# Patient Record
Sex: Male | Born: 1935 | Race: White | Hispanic: No | Marital: Married | Smoking: Former smoker
Health system: Southern US, Community
[De-identification: ages and names within clinical notes are randomized; demographics above are authoritative.]

## PROBLEM LIST (undated history)

## (undated) DIAGNOSIS — I2510 Atherosclerotic heart disease of native coronary artery without angina pectoris: Secondary | ICD-10-CM

## (undated) DIAGNOSIS — E7800 Pure hypercholesterolemia, unspecified: Secondary | ICD-10-CM

## (undated) DIAGNOSIS — J189 Pneumonia, unspecified organism: Secondary | ICD-10-CM

## (undated) DIAGNOSIS — E039 Hypothyroidism, unspecified: Secondary | ICD-10-CM

## (undated) DIAGNOSIS — C44300 Unspecified malignant neoplasm of skin of unspecified part of face: Secondary | ICD-10-CM

## (undated) DIAGNOSIS — S0291XA Unspecified fracture of skull, initial encounter for closed fracture: Secondary | ICD-10-CM

## (undated) DIAGNOSIS — I2109 ST elevation (STEMI) myocardial infarction involving other coronary artery of anterior wall: Secondary | ICD-10-CM

## (undated) DIAGNOSIS — I6529 Occlusion and stenosis of unspecified carotid artery: Secondary | ICD-10-CM

## (undated) DIAGNOSIS — I10 Essential (primary) hypertension: Secondary | ICD-10-CM

## (undated) HISTORY — DX: Atherosclerotic heart disease of native coronary artery without angina pectoris: I25.10

## (undated) HISTORY — DX: Occlusion and stenosis of unspecified carotid artery: I65.29

## (undated) HISTORY — PX: CORONARY ANGIOPLASTY: SHX604

## (undated) HISTORY — PX: SKIN CANCER EXCISION: SHX779

## (undated) HISTORY — DX: Pure hypercholesterolemia, unspecified: E78.00

## (undated) HISTORY — DX: ST elevation (STEMI) myocardial infarction involving other coronary artery of anterior wall: I21.09

## (undated) HISTORY — PX: CATARACT EXTRACTION W/ INTRAOCULAR LENS  IMPLANT, BILATERAL: SHX1307

---
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
## 1991-07-20 DIAGNOSIS — I2109 ST elevation (STEMI) myocardial infarction involving other coronary artery of anterior wall: Secondary | ICD-10-CM

## 1991-07-20 HISTORY — DX: ST elevation (STEMI) myocardial infarction involving other coronary artery of anterior wall: I21.09

## 1997-10-24 ENCOUNTER — Other Ambulatory Visit: Admission: RE | Admit: 1997-10-24 | Discharge: 1997-10-24 | Payer: Self-pay | Admitting: Cardiology

## 1997-12-17 ENCOUNTER — Other Ambulatory Visit: Admission: RE | Admit: 1997-12-17 | Discharge: 1997-12-17 | Payer: Self-pay | Admitting: Cardiology

## 2002-09-17 ENCOUNTER — Ambulatory Visit (HOSPITAL_COMMUNITY): Admission: RE | Admit: 2002-09-17 | Discharge: 2002-09-17 | Payer: Self-pay | Admitting: Gastroenterology

## 2002-11-26 ENCOUNTER — Ambulatory Visit (HOSPITAL_COMMUNITY): Admission: RE | Admit: 2002-11-26 | Discharge: 2002-11-27 | Payer: Self-pay | Admitting: Cardiology

## 2004-07-19 HISTORY — PX: CORONARY ANGIOPLASTY WITH STENT PLACEMENT: SHX49

## 2005-01-04 ENCOUNTER — Ambulatory Visit (HOSPITAL_COMMUNITY): Admission: RE | Admit: 2005-01-04 | Discharge: 2005-01-05 | Payer: Self-pay | Admitting: Cardiology

## 2008-01-16 ENCOUNTER — Encounter: Payer: Self-pay | Admitting: Cardiology

## 2008-02-21 ENCOUNTER — Encounter: Payer: Self-pay | Admitting: Cardiology

## 2008-04-09 ENCOUNTER — Encounter: Payer: Self-pay | Admitting: Cardiology

## 2008-07-30 ENCOUNTER — Encounter: Payer: Self-pay | Admitting: Cardiology

## 2009-01-29 ENCOUNTER — Encounter: Payer: Self-pay | Admitting: Cardiology

## 2009-07-31 ENCOUNTER — Encounter: Payer: Self-pay | Admitting: Cardiology

## 2010-02-02 ENCOUNTER — Encounter: Payer: Self-pay | Admitting: Cardiology

## 2010-08-05 ENCOUNTER — Encounter: Payer: Self-pay | Admitting: Cardiology

## 2010-08-07 ENCOUNTER — Ambulatory Visit: Payer: Self-pay | Admitting: Cardiology

## 2010-12-04 NOTE — Op Note (Signed)
NAME:  Ronald English, Ronald English NO.:  [id number]   MEDICAL RECORD NO.:  [id number]                   PATIENT TYPE:  AMB   LOCATION:  ENDO                                 FACILITY:  [facility]   PHYSICIAN:  Petra Kuba, M.D.                 DATE OF BIRTH:  1926-06-09   DATE OF PROCEDURE:  09/17/2002  DATE OF DISCHARGE:                                 OPERATIVE REPORT   PROCEDURE:  Colonoscopy.   INDICATIONS:  A strong family history of colon cancer; way overdue for  colonic screening.  Multiple mild GI complaints.  Consent was signed after  risks benefits, methods and options were thoroughly discussed in the office.   MEDICATIONS USED:  1. Demerol 50 mg .  2. Versed 5 mg.   DESCRIPTION OF PROCEDURE:  Rectal inspection is pertinent for external  hemorrhoids.  Digital examination was negative.  Pediatric video adjustable  colonoscope was inserted, easily advanced around the colon to the cecum.  This did require some abdominal pressure but no position changes.  Based on  the patient's wishes at that juncture, we turned him on his back.  The scope  was inserted a short way into the terminal ileum, which was normal.  Photo  documentation was obtained.  The scope was slowly withdrawn.  The prep was  adequate.   Other than some left-sided occasional diverticula seen on insertion, no  other abnormalities were seen on insertion.  On slow withdrawal through the  colon, again the diverticula were confirmed on the left side (just  occasional).   There were no other lesions, masses or other abnormalities were seen as we  slowly withdrew back  to the rectum.  When we got to the mid-sigmoid, we turned him back onto his  left side for another few minutes.  Once back in the rectum the scope was  retroflexed, pertinent for some internal hemorrhoids.  The scope was then  admitted down a short way upper left side of the colon.  The air was  suctioned, the patient rolled back  onto his back, air was suctioned after we  advanced to level further.  The scope was then removed.  The patient  tolerated the procedure well.  There was no obvious immediate complication.   ENDOSCOPIC DIAGNOSES:  1. Small internal and external hemorrhoids.  2. Digital left-sided diverticula.  3. Otherwise within normal limits to the terminal ileum.    PLAN:  We will repeat guaiacs per primary care.  Will be happy to see him  back p.r.n., particularly if symptoms continue.  Otherwise, consider repeat  colon screening in five years if doing well medically.  Petra Kuba, M.D.    MEM/MEDQ  D:  09/17/2002  T:  09/17/2002  Job:  [id number]   cc:   Colleen Can. Deborah Chalk, M.D.  [address], [address]  [place]  [place] [address]  Fax: [phone]

## 2010-12-04 NOTE — Discharge Summary (Signed)
NAME:  Ronald English, Ronald English NO.:  [id number]   MEDICAL RECORD NO.:  [id number]                   PATIENT TYPE:  OIB   LOCATION:  6525                                 FACILITY:  [facility]   PHYSICIAN:  Colleen Can. Deborah Chalk, M.D.            DATE OF BIRTH:  01/03/1926   DATE OF ADMISSION:  11/26/2002  DATE OF DISCHARGE:  11/27/2002                                 DISCHARGE SUMMARY   DISCHARGE DIAGNOSES:  1. Recurrent episodes of chest pain with subsequent elective cardiac     catheterization, stent deployment x2 to the right coronary artery, stent     deployment x1 to the left anterior descending.   SECONDARY DISCHARGE DIAGNOSES:  1. Ischemic heart disease with previous anteroseptal myocardial infarction     treated with angioplasty in 6/93 with subsequent repeat angioplasty to     the left anterior descending in 12/93.  2. Hyperlipidemia with refusal of statin therapy.  3. Diverticulosis.  4. Hypertension.   HISTORY OF PRESENT ILLNESS:  The patient is a 77 year old male who has known  ischemic heart disease.  He had previous heart attack 11 years ago and had  angioplasty acutely at that time.  He had restenosis and had repeat  angioplasty several months after that to the left anterior descending.  He  has basically done well from a cardiac standpoint since that time.  He does  refuse to take statin therapy but takes a multitude of vitamins.  He  presented to the office on 11/23/02 after an approximate two year absence.  He  had recurrent episodes of chest pain which was somewhat similar to his  previous chest pain syndrome.  He had a multitude of other somatic  complaints as well.  In light of his change in symptomatology, he was  referred for elective cardiac catheterization to sort out his complaint.   Please see dictated History and Physical for further patient presentation  and profile.   LABORATORY DATA:  CBC was normal.  Cholesterol panel showed a  total  cholesterol of 228, triglycerides 154, LDL 141, HDL of 56.  CBC was normal.  PT and PTT were unremarkable.   HOSPITAL COURSE:  The patient was admitted electively in order to undergo  cardiac catheterization.  The procedure was performed by Colleen Can. Deborah Chalk,  M.D. without any known complications.  LV function was normal.  Left main  was normal.  The LAD had a proximal 40% narrowing with a subsequent 80%  narrowing, subsequent 3.0 x 12 mm TAXUS stent was deployed with an overall  satisfactory result obtained.  There were irregularities into the left  circumflex.  The right coronary artery had 95% occlusion proximally as well  as distally.  A 3.0 x 12 mm and subsequent 2.75 x 24 ml stents were placed  to the right coronary with overall satisfactory result obtained as well.  The patient received IV nitroglycerin, heparin,  Integrilin and Plavix.  He  was subsequently transferred to 6500 and today, on 11/27/02, he is doing well  without complaints.   Physical exam is remarkable.  The groin is unremarkable and he is felt to be  a stable candidate for discharge today.   DISCHARGE MEDICATIONS:  1. He will resume his baby aspirin and Lopressor 25 mg a day.  2. Plavix 75 mg a day for six months which he is strongly encouraged to     adhere to.   ACTIVITY:  Activity is to be light over the next 3-5 days.   FOLLOW UP:  Will have him followup in the office in approximately two weeks.  He is asked to call to schedule that appointment.      Juanell Fairly C. Earl Gala, N.P.                 Colleen Can. Deborah Chalk, M.D.    LCO/MEDQ  D:  11/27/2002  T:  11/28/2002  Job:  [id number]

## 2010-12-04 NOTE — Cardiovascular Report (Signed)
NAME:  Ronald English, Ronald English NO.:  [id number]   MEDICAL RECORD NO.:  [id number]          PATIENT TYPE:  OIB   LOCATION:  2872                         FACILITY:  [facility]   PHYSICIAN:  Colleen Can. Deborah Chalk, M.D.DATE OF BIRTH:  09-Jun-1926   DATE OF PROCEDURE:  01/04/2005  DATE OF DISCHARGE:                              CARDIAC CATHETERIZATION   HISTORY:  Ronald English has had previous PCI on multiple occasions.  He had a  previous anterior septal myocardial infarction in 1993.  He has had stents  in the left anterior descending and right coronary artery and had  essentially normal left ventricular function at that time.  Because of  increasing chest pain and abnormal stress Cardiolite study with apical  ischemia, he was referred for catheterization.   PROCEDURE:  Left heart catheterization with selective coronary angiography,  left ventricular angiography.   TYPE AND SITE OF ENTRY:  Percutaneous right femoral artery.   CATHETER:  6 French 4 curved Judkins right and left coronary catheters, 6  French pigtail ventriculogram catheter, 6 [place] JL4 guide, Prowater balloon  as well as an Asahi soft, initially 2 by 9 mm Maverick, subsequently attempt  at 2.5 by 13 mm Cypher unsuccessfully, then 2.5 by 15 mm Quantum Maverick  proximally deployed with 2.5 by 13 mm Cypher stent proximally with a 2.5 by  8 mm Taxus stent unsuccessful in crossing the distal lesion.   CONTRAST MATERIAL:  Omnipaque, 360 mL.   MEDICATIONS GIVEN PRIOR TO THE PROCEDURE:  Valium 10 mg p.o.   MEDICATIONS GIVEN DURING THE PROCEDURE:  Versed 2 mg IV, Integrilin and  heparin, intracoronary nitroglycerin.   COMMENTS:  The patient tolerated the procedure well.   HEMODYNAMIC DATA:  The aortic pressure was 93/41, LV 101/0-7, there was no  aortic gradient noted on pull back.   ANGIOGRAPHIC DATA:  1.  Left main coronary artery is calcified but essentially normal.   1.  Right coronary artery has diffuse  irregularities.  The stent is patent      although there is some haziness at the distal portion of the stent and      it is felt to be patent.  There is a small posterior descending vessel      but no significant continuation branch.  The right coronary artery is      patent.   1.  Left circumflex continues as a trifurcating obtuse marginal branch.  It      has irregularities with 30% proximal narrowing but no significant focal      stenosis.   1.  Left anterior descending  is heavily calcified proximally.  The stent is      present proximally.  Thee is a large first diagonal vessel free of      significant obstructive disease.  There is calcification and tortuosity      in the proximal left anterior descending.  Approximately 3/4 of the way      down the vessel and prior to the apex, there is a severe 90% focal      stenosis.   We then  turned our attention to attempted angioplasty and stent placement in  the distal left anterior descending.  The JL4 6 [place] guide provided  satisfactory back up.  We initially passed a Prowater guide-wire across the  stenosis and were able to pass a 2 by 9 Maverick balloon inflated to 13 and  then 12 atmospheres successfully.  This was a satisfactory predilatation.  We then attempted to place a 2.5 by 13 mm Cypher stent distally.  We were  unable to pass through the area of calcified tortuosity in the proximal left  anterior descending .  We returned with a 2.5 by 15 mm Quantum after raising  a flap in the proximal segment.  The vessel was pre-dilated proximally.  Our  plan at that point in time was to try to stent distally and then return and  stent more proximally.  However, as the 2.5 by 13 mm Cypher stent was passed  into the proximal stenosis, we could no longer pass it distally.  The Cypher  stent was deployed at 20 atmospheres which would over size it relative to  the 2.5 mm diameter.  After the Cypher stent placement, the vessel appeared  to be  satisfactory at that point in time.  We lost our guide-wire position  and were able to recross across the stent.  However, we were unable to place  a 2.5 by 8 mm Taxus stent distally.  We returned and initially dilated again  with a 2 by 9 mm Maverick balloon but then returned with a 2.5 by 15 mm  Quantum balloon.  We had several inflations up to 16 atmospheres.  With  that, it appeared that we had an excellent distal POBA result distally.  The  guide-wires were removed and the final result of the left anterior  descending showed very satisfactory stent placement approximately 2/3 of the  way down the vessel with satisfactory POBA result approximately 3/4 of the  way down the vessel at the initial area of severe stenosis.   The left ventricular angiogram was performed which showed normal left  ventricular function.   IMPRESSION:  1.  Normal left ventricular function.  2.  Severe stenosis in the distal left anterior descending with successful      balloon angioplasty distally with a Cypher stent placement in the more      proximal portion of the vessel.  3.  Diffuse irregularities in the remainder of the coronary anatomy.   DISCUSSION:  It is felt that this should result in significant improvement  in coronary flow for Ronald English.  We will need to be cautious about  possibility of restenosis in the distal left anterior descending.       SNT/MEDQ  D:  01/04/2005  T:  01/04/2005  Job:  [id number]

## 2010-12-04 NOTE — H&P (Signed)
NAME:  CALYX, HAWKER NO.:  [id number]   MEDICAL RECORD NO.:  [id number]          PATIENT TYPE:  OIB   LOCATION:                               FACILITY:  [facility]   PHYSICIAN:  Colleen Can. Deborah Chalk, M.D.DATE OF BIRTH:  Oct 12, 1925   DATE OF ADMISSION:  01/04/2005  DATE OF DISCHARGE:                                HISTORY & PHYSICAL   CHIEF COMPLAINT:  Chest pain.   HISTORY OF PRESENT ILLNESS:  Mr. San is a 77 year old white male who  has a known history of ischemic heart disease that dates back to 40. He  had an anteroseptal myocardial infarction at that time. He has had acute  angioplasty performed to the LAD as well as subsequent stent placement to  the right coronary and left anterior descending in May of 2004. He presents  for a repeat catheterization due to complaints of recurrent chest pain. He  has had a positive Adenosine Cardiolite study which showed an equivocal  apical defect suggestive of ischemia, there was normal ejection fraction of  62% with normal wall motion. In light of his ongoing symptoms he is now  referred for repeat catheterization.   PAST MEDICAL HISTORY:  1.  Atherosclerotic cardiovascular disease. He had a previous anteroseptal      MI in 1993. He had acute angioplasty to the LAD at that time.      Subsequently had re-stenosis and had repeat angioplasty in December of      1993. His last catheterization occurred in 2004 with subsequent stent      placement to the LAD and right coronary artery. At that time he had      normal LV function and scattered irregularities otherwise.  2.  Hyperlipidemia. He refuses to take Statin therapy.  3.  Recent viral illness.  4.  Hypertension.   ALLERGIES:  None.   CURRENT MEDICATIONS:  1.  Aspirin daily.  2.  Lopressor 25 mg daily.  3.  A multitude of vitamins.   FAMILY HISTORY:  Noncontributory.   SOCIAL HISTORY:  He is married. He has no current alcohol or tobacco use.   REVIEW OF  SYSTEMS:  He has had recurrent chest pain over the last 1-2 weeks.  He has been using some nitroglycerin on occasion with questionable relief.  He has been slow to get back into his exercise program due to a recent viral  illness as well as his wife being sick with bronchitis.  He has had vague  left chest pain that has been worse with deep inspiration. 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  He has had really no symptoms at rest.   PHYSICAL EXAMINATION:  GENERAL:  He is a pleasant, elderly male in no acute  distress.  VITAL SIGNS:  Blood pressure 130/60 sitting, 110/70 standing, heart rate 72.  Weight is 121 pounds.  SKIN:  Warm and dry. Color is unremarkable.  LUNGS:  Clear.  HEART:  Regular rhythm.  ABDOMEN:  Soft, positive bowel sounds, nontender.  EXTREMITIES:  Without edema.   PERTINENT LABS:  Pending.   IMPRESSION:  1.  Chest pain.  2.  Abnormal Adenosine Cardiolite.  3.  Known atherosclerotic cardiovascular disease; previous history of      anteroseptal myocardial infarction and subsequent angioplasty acutely in      1993 to the LAD, and most recently with stent placement to the LAD and      right coronary in 2004.  4.  Hyperlipidemia with refusal to take Statin therapy.   PLAN:  Will proceed on with repeat catheterization. The procedure has been  reviewed in full detail with both he and his wife and they are willing to  proceed.       LC/MEDQ  D:  01/01/2005  T:  01/01/2005  Job:  [id number]

## 2010-12-04 NOTE — H&P (Signed)
NAME:  Ronald English, Ronald English NO.:  [id number]   MEDICAL RECORD NO.:  [id number]                   PATIENT TYPE:  OIB   LOCATION:                                       FACILITY:  [facility]   PHYSICIAN:  Colleen Can. Deborah Chalk, M.D.            DATE OF BIRTH:  07/17/1926   DATE OF ADMISSION:  11/26/2002  DATE OF DISCHARGE:                                HISTORY & PHYSICAL   CHIEF COMPLAINT:  New onset of chest pain.   HISTORY OF PRESENT ILLNESS:  The patient is a 77 year old white male who has  known ischemic heart disease.  He suffered a previous anteroseptal MI 11  years ago and had angioplasty acutely to the left anterior descending at  that time.  He did have restenosis and had repeat angioplasty in 12/93.   He presents to the our office on 11/23/02 after an approximate two-year  absence.  He has basically done well up until the past few days.  He notes  that he had an episode where he had pain going up the left side of his neck  as well as in the back of his ear.  This occurred after working out in his  yard.  He subsequently became dizzy and somewhat fuzzy headed.  He  basically lost his energy and felt nauseated but had no frank vomiting.  That following evening, he began having chest tightness, and he has had  recurrent episodes of chest pain which has primary been localized to the  left shoulder.  It is somewhat similar to his previous chest pain syndrome.  He has also noted a sensation of tachycardia with a heart rate of  approximately 120.  He has had recurrent bouts of chest pain that have  occurred with very little activity.  He has clearly had a change in  symptomatology and is now referred for elective cardiac catheterization.   PAST MEDICAL HISTORY:  1. Ischemic heart disease with previous anteroseptal MI treated with     angioplasty in 6/93 with subsequent repeat angioplasty in 12/93.  2. Hyperlipidemia, currently off all therapy.  3.  Diverticulosis.  4. Previous cataract surgery.  5. Previous skin grafting.  6. Remove history of head concussion.  7. Hypertension.   ALLERGIES:  Questionable pain medicine.   CURRENT MEDICINES:  A multitude of vitamins and minerals as well as  Lopressor 25 mg a day and baby aspirin daily.   FAMILY HISTORY:  Father died at 88 due to myocardial infarction.  Mother  died at 65 with a history of stroke.   SOCIAL HISTORY:  He is a remote smoker.  He has no alcohol use.  He does try  to remain active.   REVIEW OF SYSTEMS:  Basically as noted above.  He has also noted some lower  right-sided quadrant discomfort and has had recent colonoscopy with reported  history of diverticulosis.  He has  had no syncope.  He has not used  nitroglycerin for his chest pain.   PHYSICAL EXAMINATION:  GENERAL:  He is an elderly white male in no acute  distress.  VITAL SIGNS:  Blood pressure 140/70 sitting, 120/70 standing, heart rate 72,  weight 135 pounds.  SKIN:  Warm and dry.  Color is unremarkable.  LUNGS:  Clear.  HEART:  Regular rhythm.  ABDOMEN:  Soft, positive bowel sounds, nontender.  EXTREMITIES:  Without edema.  HEENT:  Does show a carotid bruit noted on the right.   LABORATORY DATA:  Pending.   OVERALL IMPRESSION:  1. New onset of chest pain of questionable etiology.  2. Known ischemic heart disease with previous anteroseptal myocardial     infarction 11 years ago treated with angioplasty to the left anterior     descending.  3. Hyperlipidemia, off statin therapy.  4. Hypertension.  5. Reports of tachycardia.  6. Right carotid bruit.   PLAN:  New prescription for nitroglycerin was provided.  Will arrange  cardiac catheterization. Will check carotid Dopplers at that time as well.  He is to call if any problems would arise in the interim or report to the  emergency room.     Juanell Fairly C. Earl Gala, N.P.                 Colleen Can. Deborah Chalk, M.D.    LCO/MEDQ  D:  11/23/2002  T:   11/23/2002  Job:  [id number]

## 2010-12-04 NOTE — Discharge Summary (Signed)
NAME:  Ronald English, Ronald English NO.:  [id number]   MEDICAL RECORD NO.:  [id number]          PATIENT TYPE:  OIB   LOCATION:  6524                         FACILITY:  [facility]   PHYSICIAN:  Colleen Can. Deborah Chalk, M.D.DATE OF BIRTH:  12/17/1925   DATE OF ADMISSION:  01/04/2005  DATE OF DISCHARGE:  01/05/2005                                 DISCHARGE SUMMARY   PRIMARY DISCHARGE DIAGNOSIS:  Chest pain with an abnormal adenosine  Cardiolite and a subsequent elective cardiac catheterization with results as  follows:  Left main coronary artery is calcified but essentially normal.  Right coronary artery has diffuse irregularities; the stent is patent  although there is some haziness of the distal portion of the stent but is  felt to be satisfactory.  There is a small posterior descending vessel with  no significant continuation branch.  The left circumflex continues as a  trifurcating obtuse marginal branch.  It has irregularities with 30%  proximal narrowing but no significant focal stenosis.  The left anterior  descending is heavily calcified proximally.  The stent is present  proximally.  There is a large first diagonal vessel free of significant  obstructive disease.  There is calcification and tortuosity in the proximal  left anterior descending coronary artery.  Approximately three-fourths of  the way down the vessel and prior to the apex, there is severe 90% focal  stenosis.  Subsequently, angioplasty and stent placement was performed to  the distal left anterior descending with some difficulty.  Attempts were  made to place a 2.5 x 13 mm Cypher stent; however, this would not pass and  subsequently it was deployed at the proximal 50% stenosis lesion.  As the  2.5 x 13 mm Cypher stent was passed into the proximal stenosis, it could no  longer be passed distally.  After this stent was placed, the vessel appeared  to be satisfactory.  The guidewire position was lost, yet we were able  to  recross across the stent; however, we were unable to place a 2.5 x 8 mm  Taxus stent distally.  Subsequently we returned and initially dilated again  with a Maverick balloon and subsequently with a Quantum balloon.  There were  several inflations up to 16 atmospheres.  Overall it appeared that an  excellent distal result was obtained postprocedure.  The guidewires were  removed and the final result of the left anterior descending showed a very  satisfactory stent placement approximately two-thirds of the way down the  vessel with satisfactory results three-fourths down the vessel of the  initial area of the severe stenosis.  Overall it is felt that this should  result in significant improvement of the coronary flow for Ronald English.  We  will need to be cautious about the possibility of restenosis in the distal  LAD.   Postprocedure he was transferred to 6500.  Today he is feeling well without  complaints.  His postprocedural labs are satisfactory.  CK-MB is negative.  Troponin is slightly elevated at 0.06.  He is felt to be a stable candidate  for discharge today.  DISCHARGE CONDITION:  Stable.   DISCHARGE MEDICATIONS:  1.  He will resume his aspirin daily.  2.  Lopressor 25 mg daily.  3.  Will add back Plavix 75 mg for the next six months.   We will plan on seeing him back in the office in 10 days.  His activity  should be light in that period.  He is to call if any problems should arise  in the interim.       LC/MEDQ  D:  01/05/2005  T:  01/05/2005  Job:  [id number]

## 2010-12-04 NOTE — Cardiovascular Report (Signed)
NAME:  Ronald English, BATALLA NO.:  [id number]   MEDICAL RECORD NO.:  [id number]                   PATIENT TYPE:  OIB   LOCATION:  6525                                 FACILITY:  [facility]   PHYSICIAN:  Colleen Can. Deborah Chalk, M.D.            DATE OF BIRTH:  01-Mar-1926   DATE OF PROCEDURE:  11/26/2002  DATE OF DISCHARGE:  11/27/2002                              CARDIAC CATHETERIZATION   HISTORY:  The patient presents with progressive angina.  He had previous  remote angioplasty of the left anterior descending.   PROCEDURE:  Left heart catheterization with selective coronary angiography,  left ventricular angiography with stent placement in the right coronary  artery with stent sequentially placed, and stent placement in the mid-left  anterior descending.   TYPE AND SITE OF ENTRY:  Percutaneous, right femoral artery.   CATHETERS:  6 French 4 curved Judkins right and left coronary catheters; 6  French pigtail ventriculographic catheter; JR4 right guide, JL4 left guide,  both 7 [place] guide catheters; high-torque floppy guidewires; and  subsequently the use of a 3.0 x 12 mm stent to the proximal right coronary  artery, 2.75 x 24 mm to the distal right coronary artery, and a 3.0 x 12 mm  stent for the mid-left anterior descending.   CONTRAST MATERIAL:  Omnipaque.   MEDICATIONS GIVEN PRIOR TO THE PROCEDURE:  Valium 10 mg p.o.   MEDICATIONS GIVEN DURING THE PROCEDURE:  Versed, fentanyl, Integrilin, IV  nitroglycerin, heparin, and Plavix.   COMMENTS:  The patient tolerated the procedure well.   HEMODYNAMIC DATA:  1. The aortic pressure was 100/70.  2. LV was 105-12.  3. There was no aortic valve gradient noted on pullback.   ANGIOGRAPHIC DATA:  1. Left main coronary artery had mild narrowing and calcification that was     nonobstructive.  2. Left circumflex:  The left circumflex had 30-40% narrowing proximally.     It had a bifurcating marginal and  posterolateral branch with no     significant obstructive disease.  3. Left anterior descending:  The left anterior descending had diffuse     irregularities as well as calcification throughout.  In the midportion of     the vessel there is 80% somewhat focal narrowing with post-stenotic     dilatation.  There was a distal 50-60% narrowing.  4. Right coronary artery:  The right coronary artery had a proximal 95%     stenosis and then a segmental 90% stenosis before the crux.  There was     diffuse disease in between these two severe stenoses, but it was not     obstructive.   ANGIOPLASTY PROCEDURE:  We initially turned our attention to the right  coronary artery.  A 3.0 x 12 mm Taxus stent was placed primarily across the  proximal lesion.  We then returned with a 2.75 x 24 mm Taxus stent that was  placed across the distal lesion.  The angiographic result was felt to be  quite satisfactory.  Although there was residual narrowed vessel between the  two stents, it was felt to not be flow-limiting.   We then turned our attention to the left anterior descending.  A guidewire  was passed across the stenosis without a great deal of difficulty.  The  third stent was then placed in the mid-left anterior descending across the  80% stenosis at that point in the vessel.  This is a 3.0 x 12 mm Taxus  stent.  The final angiographic result was felt to be excellent.   OVERALL IMPRESSION:  1. Normal left ventricular function.  2. Severe stenosis in a sequential manner in the right coronary artery with     successful stent placement.  3. Severe stenosis in the mid-left anterior descending with successful stent     placement.  4. Somewhat diffuse, scattered irregularities otherwise.   DISCUSSION:  It is felt that the patient tolerated this well and had a  satisfactory dye load and should do well clinically.                                               Colleen Can. Deborah Chalk, M.D.    SNT/MEDQ  D:   11/26/2002  T:  11/28/2002  Job:  [id number]

## 2011-01-15 ENCOUNTER — Encounter: Payer: Self-pay | Admitting: Cardiology

## 2011-01-18 ENCOUNTER — Encounter: Payer: Self-pay | Admitting: Cardiology

## 2011-01-18 ENCOUNTER — Ambulatory Visit (INDEPENDENT_AMBULATORY_CARE_PROVIDER_SITE_OTHER): Payer: Medicare Other | Admitting: Cardiology

## 2011-01-18 DIAGNOSIS — E7800 Pure hypercholesterolemia, unspecified: Secondary | ICD-10-CM

## 2011-01-18 DIAGNOSIS — I2109 ST elevation (STEMI) myocardial infarction involving other coronary artery of anterior wall: Secondary | ICD-10-CM

## 2011-01-18 DIAGNOSIS — I6529 Occlusion and stenosis of unspecified carotid artery: Secondary | ICD-10-CM

## 2011-01-18 NOTE — Assessment & Plan Note (Addendum)
The patient is not having new symptoms. I would like to consider stress testing him as it has been 3 years and he has had multiple stents. However, I'm sure after talking to him that I need to earn his trust him know him better before ordering any tests.  We will try to encourage continued risk reduction.  (Greater than 40 minutes with face to face and review of all hospital and office records.)

## 2011-01-18 NOTE — Assessment & Plan Note (Signed)
His lipids are not at all at target but her refuses any statins.

## 2011-01-18 NOTE — Progress Notes (Signed)
HPI The patient presents as a new patient after Dr. Deborah Chalk retired.  He has a history of coronary disease as described below. His last catheterization was 2006 with a stress test in 2009. Since that time he says he does well. The patient denies any new symptoms such as chest discomfort, neck or arm discomfort. There has been no new shortness of breath, PND or orthopnea. There have been no reported palpitations, presyncope or syncope.  He does yard work.  He does complain of low weights though he eats well.  Allergies  Allergen Reactions  . Codeine     Current Outpatient Prescriptions  Medication Sig Dispense Refill  . metoprolol tartrate (LOPRESSOR) 25 MG tablet Take 25 mg by mouth.        . Thiamine HCl (VITAMIN B-1) 100 MG tablet Take 100 mg by mouth daily.        Marland Kitchen thyroid (ARMOUR) 30 MG tablet Take 30 mg by mouth daily.          Past Medical History  Diagnosis Date  . Anteroseptal myocardial infarction 1993    2006 left main normal, right coronary artery patent stent, circumflex 30% marginal stenosis, LAD distal 90% stenosis. There is a patent proximal stent. He has apparently had Cypher stenting to the distal segment.  . Hypercholesterolemia   . H/O: hypothyroidism   . Carotid stenosis     No past surgical history on file.  ROS:  As stated in the HPI and negative for all other systems.  PHYSICAL EXAM BP 147/68  Pulse 63  Ht 5\' 7"  (1.702 m)  Wt 118 lb (53.524 kg)  BMI 18.48 kg/m2 GENERAL:  Well appearing HEENT:  Pupils equal round and reactive, fundi not visualized, oral mucosa unremarkable NECK:  No jugular venous distention, waveform within normal limits, carotid upstroke brisk and symmetric, no bruits, no thyromegaly LYMPHATICS:  No cervical, inguinal adenopathy LUNGS:  Clear to auscultation bilaterally BACK:  No CVA tenderness CHEST:  Unremarkable HEART:  PMI not displaced or sustained,S1 and S2 within normal limits, no S3, no S4, no clicks, no rubs, no murmurs ABD:   Flat, positive bowel sounds normal in frequency in pitch, no bruits, no rebound, no guarding, no midline pulsatile mass, no hepatomegaly, no splenomegaly EXT:  2 plus pulses upper diminished lower, no edema, no cyanosis no clubbing SKIN:  No rashes no nodules NEURO:  Cranial nerves II through XII grossly intact, motor grossly intact throughout PSYCH:  Cognitively intact, oriented to person place and time   EKG:  Sinus rhythm, rate 63, axis within normal limits, intervals within normal limits, no acute ST-T wave changes.   ASSESSMENT AND PLAN

## 2011-01-18 NOTE — Patient Instructions (Signed)
Follow up in 6 months with Dr Hochrein.  You will receive a letter in the mail 2 months before you are due.  Please call us when you receive this letter to schedule your follow up appointment.  Continue current medications  

## 2011-01-18 NOTE — Assessment & Plan Note (Signed)
I had a long discussion with him.  I found a carotid Doppler from 2004 demonstrating 60-80% left stenosis and 40-60% right stenosis. This was in the hospital and clearly is good study though he says he's never had one of these. I have suggested repeat carotid Doppler and discussed with him the risk of stroke. He will consider this. Again I think a suspicious of these results and just meeting me may not trust my suggestions will again I hope to gain his confidence.

## 2011-01-22 ENCOUNTER — Encounter: Payer: Self-pay | Admitting: Cardiology

## 2012-11-16 ENCOUNTER — Ambulatory Visit (INDEPENDENT_AMBULATORY_CARE_PROVIDER_SITE_OTHER): Payer: Medicare Other | Admitting: Cardiology

## 2012-11-16 ENCOUNTER — Encounter: Payer: Self-pay | Admitting: Cardiology

## 2012-11-16 VITALS — BP 150/64 | HR 75 | Ht 67.0 in | Wt 115.4 lb

## 2012-11-16 DIAGNOSIS — I2510 Atherosclerotic heart disease of native coronary artery without angina pectoris: Secondary | ICD-10-CM | POA: Insufficient documentation

## 2012-11-16 DIAGNOSIS — Z8679 Personal history of other diseases of the circulatory system: Secondary | ICD-10-CM

## 2012-11-16 DIAGNOSIS — Z8639 Personal history of other endocrine, nutritional and metabolic disease: Secondary | ICD-10-CM

## 2012-11-16 DIAGNOSIS — E7800 Pure hypercholesterolemia, unspecified: Secondary | ICD-10-CM

## 2012-11-16 MED ORDER — NITROGLYCERIN 0.4 MG SL SUBL: 0.4000 mg | SUBLINGUAL_TABLET | SUBLINGUAL | Status: DC | PRN

## 2012-11-16 NOTE — Progress Notes (Signed)
Ronald English Date of Birth: Jul 23, 1925 Medical Record #[id number]  History of Present Illness: Ronald English is seen to establish cardiac care. This is my first interaction with Ronald English. He is a former patient of Ronald English. He saw Ronald English in 2012 but wanted to see me today. He has a fairly extensive coronary history. He had a myocardial infarction in 1993 that was treated with direct angioplasty. He reports that he had to go back later to have this opened up again. In 2004 he underwent stenting of the LAD and right coronary. The proximal right coronary was stented with a 3.0 x 12 mm Taxus stent the distal right coronary with a 2.75 x 24 mm Taxus stent in the LAD with a 3.0 x 12 mm Taxus stent. In 2006 he presented again in head stenting of the mid to distal LAD with a Cypher stent. The distal LAD was treated with PCI after being unable to deliver a stent. His last stress test in 2009 was normal.  He presents at this time with symptoms of angina. This has been present for at least 6 months. He initially noted this after cutting  cord wood for his house with a chainsaw. This is described as severe tightness and pain in his left arm and shoulder with some tightness in his chest. It did resolve with rest. He gets similar symptoms with exertion now. 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 He still gets up on his roof to clean out his gutters. 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  Ronald English refuses to take cholesterol-lowering medication. He states that he took something over-the-counter and got his cholesterol down to 165 but he thought this was too low and so he stopped taking it. He states that you can "die from low cholesterol". He also will not take aspirin because he thinks this caused him to have some visual changes in the past. he thinks that higher doses of Lopressor in the past made him  feel too tired. He insists that he doesn't have plaque in his arteries but that Ronald English told him his arteries were simply shrinking.  Current Outpatient Prescriptions on File Prior to Visit  Medication Sig Dispense Refill  . metoprolol tartrate (LOPRESSOR) 25 MG tablet Take 25 mg by mouth.        . Thiamine HCl (VITAMIN B-1) 100 MG tablet Take 100 mg by mouth daily.        Marland Kitchen thyroid (ARMOUR) 30 MG tablet Take 30 mg by mouth daily.         No current facility-administered medications on file prior to visit.    Allergies  Allergen Reactions  . Codeine     Past Medical History  Diagnosis Date  . Anteroseptal myocardial infarction 1993    2006 left main normal, right coronary artery patent stent, circumflex 30% marginal stenosis, LAD distal 90% stenosis. There is a patent proximal stent. He has apparently had Cypher stenting to the distal segment.  . Hypercholesterolemia   . H/O: hypothyroidism   . Carotid stenosis   . CAD (coronary artery disease)     History reviewed. No pertinent past surgical history.  History  Smoking status  . Former Smoker  . Quit date: 07/19/1968  Smokeless tobacco  . Not on file    History  Alcohol Use: Not  on file    Family History  Problem Relation Age of Onset  . Stroke Mother   . Angina Father     Review of Systems: as noted in history of present illness.  All other systems were reviewed and are negative.  Physical Exam: BP 150/64  Pulse 75  Ht 5\' 7"  (1.702 m)  Wt 115 lb 6.4 oz (52.345 kg)  BMI 18.07 kg/m2  SpO2 99%  He is an elderly white male in no acute distress. HEENT: Normocephalic, atraumatic. Pupils equal round and reactive. Sclera clear. Oropharynx is clear. Neck is without JVD, adenopathy, thyromegaly, or bruits. Lungs: Clear Cardiovascular: Regular rate and rhythm. Normal S1 and S2. No gallop, murmur, or click. Abdomen: Soft and nontender. No masses or bruits. Extremities: No cyanosis or edema. Pedal pulses are  good. Skin: Warm and dry. Neuro: Alert and oriented x3. Cranial nerves II through XII are intact. LABORATORY DATA:   Assessment / Plan: 1. Coronary disease with remote anterior myocardial infarction. Multiple stenting procedures in the past. Now with class II angina. Patient is really unwilling to take any additional medical therapy. I recommended that he continue on his current dose of metoprolol and use nitroglycerin sublingual as needed for pain. I stressed the importance of taking aspirin and the benefits of cholesterol-lowering medication. He has no interest in taking this. We discussed possible followup stress testing but I don't know what I would do with the results. He is not a candidate for stenting given his inability to follow through with medical guidelines. He might be a candidate for bypass but he states he would not consider this. If his anginal symptoms progress we could consider additional medication such as a long-acting nitrate or calcium channel blocker but I think in appearance with medical regimen is probably not going to be very good.  2. Hyperlipidemia. Patient refuses to take cholesterol-lowering therapy.  3. Hypothyroidism. Labs are monitored by his primary care.

## 2012-11-16 NOTE — Patient Instructions (Signed)
Continue your current therapy  Use Ntg sublingual as needed for chest/ left arm pain.  I will see you again in 6 months.

## 2013-01-03 ENCOUNTER — Encounter (HOSPITAL_COMMUNITY): Payer: Self-pay | Admitting: Pharmacy Technician

## 2013-01-09 ENCOUNTER — Ambulatory Visit (HOSPITAL_COMMUNITY)
Admission: RE | Admit: 2013-01-09 | Discharge: 2013-01-10 | Disposition: A | Discharge status: Home / Self Care | Payer: Medicare Other | Source: Ambulatory Visit | Attending: Cardiology | Admitting: Cardiology

## 2013-01-09 ENCOUNTER — Encounter (HOSPITAL_COMMUNITY): Payer: Self-pay | Admitting: General Practice

## 2013-01-09 ENCOUNTER — Encounter (HOSPITAL_COMMUNITY)
Admission: RE | Disposition: A | Discharge status: Home / Self Care | Payer: Self-pay | Source: Ambulatory Visit | Attending: Cardiology

## 2013-01-09 DIAGNOSIS — Z9861 Coronary angioplasty status: Secondary | ICD-10-CM | POA: Insufficient documentation

## 2013-01-09 DIAGNOSIS — I10 Essential (primary) hypertension: Secondary | ICD-10-CM | POA: Insufficient documentation

## 2013-01-09 DIAGNOSIS — T82897A Other specified complication of cardiac prosthetic devices, implants and grafts, initial encounter: Secondary | ICD-10-CM | POA: Insufficient documentation

## 2013-01-09 DIAGNOSIS — I2510 Atherosclerotic heart disease of native coronary artery without angina pectoris: Secondary | ICD-10-CM | POA: Insufficient documentation

## 2013-01-09 DIAGNOSIS — Z79899 Other long term (current) drug therapy: Secondary | ICD-10-CM | POA: Insufficient documentation

## 2013-01-09 DIAGNOSIS — Y831 Surgical operation with implant of artificial internal device as the cause of abnormal reaction of the patient, or of later complication, without mention of misadventure at the time of the procedure: Secondary | ICD-10-CM | POA: Insufficient documentation

## 2013-01-09 DIAGNOSIS — I252 Old myocardial infarction: Secondary | ICD-10-CM | POA: Insufficient documentation

## 2013-01-09 DIAGNOSIS — I209 Angina pectoris, unspecified: Secondary | ICD-10-CM | POA: Insufficient documentation

## 2013-01-09 DIAGNOSIS — I2584 Coronary atherosclerosis due to calcified coronary lesion: Secondary | ICD-10-CM | POA: Insufficient documentation

## 2013-01-09 DIAGNOSIS — E785 Hyperlipidemia, unspecified: Secondary | ICD-10-CM | POA: Insufficient documentation

## 2013-01-09 HISTORY — DX: Unspecified fracture of skull, initial encounter for closed fracture: S02.91XA

## 2013-01-09 HISTORY — DX: Unspecified malignant neoplasm of skin of unspecified part of face: C44.300

## 2013-01-09 HISTORY — DX: Essential (primary) hypertension: I10

## 2013-01-09 HISTORY — DX: Hypothyroidism, unspecified: E03.9

## 2013-01-09 HISTORY — PX: ATHERECTOMY: SHX5502

## 2013-01-09 HISTORY — DX: Pneumonia, unspecified organism: J18.9

## 2013-01-09 HISTORY — PX: LEFT HEART CATHETERIZATION WITH CORONARY ANGIOGRAM: SHX5451

## 2013-01-09 LAB — POCT ACTIVATED CLOTTING TIME: Activated Clotting Time: 334 s

## 2013-01-09 SURGERY — LEFT HEART CATHETERIZATION WITH CORONARY ANGIOGRAM
Anesthesia: LOCAL

## 2013-01-09 MED ORDER — CLOPIDOGREL BISULFATE 75 MG PO TABS
75.0000 mg | ORAL_TABLET | Freq: Every day | ORAL | Status: DC
  Administered 2013-01-10: 75 mg via ORAL

## 2013-01-09 MED ORDER — SODIUM CHLORIDE 0.9 % IJ SOLN: 3.0000 mL | Freq: Two times a day (BID) | INTRAMUSCULAR | Status: DC

## 2013-01-09 MED ORDER — METOPROLOL TARTRATE 25 MG PO TABS
25.0000 mg | ORAL_TABLET | Freq: Two times a day (BID) | ORAL | Status: DC
  Administered 2013-01-09 – 2013-01-10 (×2): 25 mg via ORAL
  Filled 2013-01-09 (×3): qty 1

## 2013-01-09 MED ORDER — BIVALIRUDIN 250 MG IV SOLR
INTRAVENOUS | Status: AC
  Filled 2013-01-09: qty 250

## 2013-01-09 MED ORDER — SODIUM CHLORIDE 0.9 % IV SOLN: 1.0000 mL/kg/h | INTRAVENOUS | Status: AC

## 2013-01-09 MED ORDER — FENTANYL CITRATE 0.05 MG/ML IJ SOLN
INTRAMUSCULAR | Status: AC
  Filled 2013-01-09: qty 2

## 2013-01-09 MED ORDER — SODIUM CHLORIDE 0.9 % IV SOLN: 250.0000 mL | INTRAVENOUS | Status: DC | PRN

## 2013-01-09 MED ORDER — ASPIRIN 81 MG PO CHEW
CHEWABLE_TABLET | ORAL | Status: AC
  Filled 2013-01-09: qty 4

## 2013-01-09 MED ORDER — HEPARIN SODIUM (PORCINE) 1000 UNIT/ML IJ SOLN
INTRAMUSCULAR | Status: AC
  Filled 2013-01-09: qty 1

## 2013-01-09 MED ORDER — SODIUM CHLORIDE 0.9 % IJ SOLN: 3.0000 mL | INTRAMUSCULAR | Status: DC | PRN

## 2013-01-09 MED ORDER — MIDAZOLAM HCL 2 MG/2ML IJ SOLN
INTRAMUSCULAR | Status: AC
  Filled 2013-01-09: qty 2

## 2013-01-09 MED ORDER — SODIUM CHLORIDE 0.9 % IV SOLN
INTRAVENOUS | Status: DC
  Administered 2013-01-09: 06:00:00 via INTRAVENOUS

## 2013-01-09 MED ORDER — ASPIRIN 81 MG PO CHEW
324.0000 mg | CHEWABLE_TABLET | ORAL | Status: DC
Start: 2013-01-10 — End: 2013-01-09

## 2013-01-09 MED ORDER — VITAMIN B-1 100 MG PO TABS
100.0000 mg | ORAL_TABLET | Freq: Every day | ORAL | Status: DC
  Administered 2013-01-09 – 2013-01-10 (×2): 100 mg via ORAL
  Filled 2013-01-09 (×2): qty 1

## 2013-01-09 MED ORDER — LIDOCAINE HCL (PF) 1 % IJ SOLN
INTRAMUSCULAR | Status: AC
  Filled 2013-01-09: qty 30

## 2013-01-09 MED ORDER — NITROGLYCERIN 0.2 MG/ML ON CALL CATH LAB
INTRAVENOUS | Status: AC
  Filled 2013-01-09: qty 1

## 2013-01-09 MED ORDER — ACETAMINOPHEN 325 MG PO TABS: 650.0000 mg | ORAL_TABLET | ORAL | Status: DC | PRN

## 2013-01-09 MED ORDER — NITROGLYCERIN 0.4 MG SL SUBL: 0.4000 mg | SUBLINGUAL_TABLET | SUBLINGUAL | Status: DC | PRN

## 2013-01-09 MED ORDER — TICAGRELOR 90 MG PO TABS
ORAL_TABLET | ORAL | Status: AC
  Filled 2013-01-09: qty 2

## 2013-01-09 MED ORDER — ONDANSETRON HCL 4 MG/2ML IJ SOLN: 4.0000 mg | Freq: Four times a day (QID) | INTRAMUSCULAR | Status: DC | PRN

## 2013-01-09 MED ORDER — THYROID 30 MG PO TABS
30.0000 mg | ORAL_TABLET | Freq: Every day | ORAL | Status: DC
  Administered 2013-01-09: 30 mg via ORAL
  Filled 2013-01-09 (×3): qty 1

## 2013-01-09 MED ORDER — ATORVASTATIN CALCIUM 10 MG PO TABS
10.0000 mg | ORAL_TABLET | Freq: Every day | ORAL | Status: DC
  Filled 2013-01-09 (×2): qty 1

## 2013-01-09 MED ORDER — HEPARIN (PORCINE) IN NACL 2-0.9 UNIT/ML-% IJ SOLN
INTRAMUSCULAR | Status: AC
  Filled 2013-01-09: qty 1000

## 2013-01-09 MED ORDER — VERAPAMIL HCL 2.5 MG/ML IV SOLN
INTRAVENOUS | Status: AC
  Filled 2013-01-09: qty 4

## 2013-01-09 NOTE — Progress Notes (Signed)
Baby ASA x 4 given per order; unable to document at this time in [facility]

## 2013-01-09 NOTE — Interval H&P Note (Signed)
History and Physical Interval Note:  01/09/2013 9:00 AM  Ronald English  has presented today for surgery, with the diagnosis of Chest pain  The various methods of treatment have been discussed with the patient and family. After consideration of risks, benefits and other options for treatment, the patient has consented to  Procedure(s): LEFT HEART CATHETERIZATION WITH CORONARY ANGIOGRAM (N/A) and possible PTCA  as a surgical intervention .  The patient's history has been reviewed, patient examined, no change in status, stable for surgery.  I have reviewed the patient's chart and labs.  Questions were answered to the patient's satisfaction.     Cath Lab Visit (complete for each Cath Lab visit)  Clinical Evaluation Leading to the Procedure:   ACS: no  Non-ACS:    Anginal Classification: CCS III  Anti-ischemic medical therapy: Minimal Therapy (1 class of medications)  Non-Invasive Test Results: No non-invasive testing performed  Prior CABG: No previous CABG   [facility]

## 2013-01-09 NOTE — H&P (Signed)
  Please see office visit notes for complete details of HPI.  

## 2013-01-09 NOTE — CV Procedure (Signed)
Procedure performed:  Left heart catheterization including hemodynamic monitoring of the left ventricle, LV gram, selective right and left coronary arteriography. PTCA and of the ostial and proximal LAD with a 1.5 mm atherectomy burr, PTCA and balloon angioplasty of the same region with a 3.0 x 15 mm Trek balloon.  Performed: Femoral arteriogram hemostasis obtained by manual pressure due to calcification of the femoral artery.  Indication patient is a 79 year-old male with history of hypertension (), hyperlipidemia (), history of known coronary artery and history of RCA stenting in 2006 with a 3.0 x 12 mm Taxus stent and a 2.75 x 24 mm Taxus stent in the distal RCA, mid LAD 3.0 x 12 mm Taxus stent. Distal LAD treated with balloon angioplasty. He now presents with class III angina pectoris. Due to his classic presentation, known coronary artery disease, he was brought directly to the cardiac catheterization lab without performing noninvasive testing.   Hemodynamic data:  Left ventricular pressure was 151/5with LVEDP of 19 mm mercury. Aortic pressure was 148/58 with a mean of 94 mm mercury. There was no pressure gradient across the aortic valve.   Left ventricle: Performed in the RAO projection revealed LVEF of 50. Mild global hypokinesis.   Right coronary artery: Is a moderate to large caliber vessel, with diffuse proximal calcification, previously placed stent in the mid RCA is patent with mild instent restenosis, distal RCA stent is widely patent, between the made RCA stent in the distal RCA stent, the crux has a 80-90% mild calcified stenosis. It is dominant.  Left main coronary artery is large and mildly calcified and bifurcates immediately.  Circumflex coronary artery: A large vessel giving origin to a large obtuse marginal 1, OM 1 has a large second branch which is 2.0-2.5 mm and has a ostial 99% stenosis. OM1 itself has mild luminal irregularity the mid circumflex after the origin of OM1 has  a 20-30% stenosis. There is again mild diffuse calcification evident.Marland Kitchen   LAD:  Is a moderate to large caliber vessel. Has severe diffuse calcification. The ostial and proximal LAD has a high-grade 99% stenosis. The LAD gives origin to several small to moderate size diagonals diffuse ostial disease. Mid LAD stent that was previously placed is widely patent with mild neointimal hyperplasia. Distal end of the stent shows mild 30-40% instent restenosis. The balloon angioplasty site in the apical LAD is patent.   Interventional data: Successful PTCA and rotational atherectomy of the proximal and ostial LAD with a 1.5 mm rotational burr followed by balloon angioplasty with a 3.0 x 15 mm trek balloon at peak of 12 atmospheric pressure x2. Stenosis reduced from 99% to less than 10%, with brisk TIMI-3 to TIMI-3 flow maintained at the end of the procedure. Stenting was not performed due to heavy calcification, excellent results with atherectomy.  Technique: Under sterile precautions using a 5 French right femoral arterial access, a 6 French sheath was introduced into the right femoral artery under fluoroscopy guidance. A 5 [place] multipurpose B2 catheter was advanced into the ascending aorta and right coronary artery was cannulated, then left coronary artery was cannulated and angiography was performed in multiple views.  A 5 French pigtail catheter advanced then into the left ventricle. Hemodynamics are analysed. LV angiogram  performed in RAO projection. Catheter pulled into the ascending aorta and  Catheter exchanged out of the body over J-Wire. NO immediate complications noted. Patient tolerated the procedure well.   Technique of intervention: I exchanged the 5 French sheath was 7  French sheath. I then utilized Angiomax for anticoagulation and then advanced a 300 cm x 0.009" rotational floppy wire into the LAD with moderate amount of difficulty. I placed the tip of the wire in the distal LAD. This was followed by  passage of a 1.5 mm bur which was utilized to perform atherectomy at160,000 rpm/sec, intracoronary nitroglycerin administration and angiography performed. Then I utilized the balloon as dictated above and performed two balloon inflations for 60 seconds each followed by intracoronary administration and repeat angiography. Excellent results was evident. The guidewire was withdrawn out of the body. Right femoral arteriogram was performed through the arterial access sheath, due to calcification it was decided against closing this with a closure device.  Patient was transferred to the recovery area in stable condition. Total of 105 cc of contrast was utilized for diagnostic and interventional procedure.   Disposition: Will be discharged hometomorrow with outpatient follow up. Patient has a high-grade stenosis in the mid RCA, will need angioplasty in a staged fashion. The OM1 stenosis is high-grade, however this be left alone due to the complexity of the anatomy. I expect significant improvement in his symptoms of angina pectoris.

## 2013-01-09 NOTE — Interval H&P Note (Signed)
History and Physical Interval Note:  01/09/2013 7:45 AM  Ronald English  has presented today for surgery, with the diagnosis of Chest pain  The various methods of treatment have been discussed with the patient and family. After consideration of risks, benefits and other options for treatment, the patient has consented to  Procedure(s): LEFT HEART CATHETERIZATION WITH CORONARY ANGIOGRAM (N/A) a and possible PTCA s a surgical intervention .  The patient's history has been reviewed, patient examined, no change in status, stable for surgery.  I have reviewed the patient's chart and labs.  Questions were answered to the patient's satisfaction.     Pamella Pert

## 2013-01-10 LAB — CBC
HCT: 37.2 % — ABNORMAL LOW (ref 39.0–52.0)
Hemoglobin: 12.8 g/dL — ABNORMAL LOW (ref 13.0–17.0)
RBC: 4.16 MIL/uL — ABNORMAL LOW (ref 4.22–5.81)
WBC: 9.7 10*3/uL (ref 4.0–10.5)

## 2013-01-10 LAB — BASIC METABOLIC PANEL
BUN: 19 mg/dL (ref 6–23)
Creatinine, Ser: 1.02 mg/dL (ref 0.50–1.35)
GFR calc Af Amer: 76 mL/min — ABNORMAL LOW (ref >90)
GFR calc non Af Amer: 65 mL/min — ABNORMAL LOW (ref >90)

## 2013-01-10 MED ORDER — ASPIRIN 81 MG PO TABS: 81.0000 mg | ORAL_TABLET | Freq: Every day | ORAL | Status: DC

## 2013-01-10 MED FILL — Sodium Chloride IV Soln 0.9%: INTRAVENOUS | Qty: 50 | Status: AC

## 2013-01-10 NOTE — Care Management Note (Signed)
    Page 1 of 1   01/10/2013     11:20:33 AM   CARE MANAGEMENT NOTE 01/10/2013  Patient:  Ronald English, Ronald English   Account Number:  [id number]  Date Initiated:  01/10/2013  Documentation initiated by:  Oletta Cohn  Subjective/Objective Assessment:   79 yo male admitted with chest pain     Action/Plan:   left heart cath/ medication assistance   Anticipated DC Date:  01/10/2013   Anticipated DC Plan:  HOME/SELF CARE         Choice offered to / List presented to:             Status of service:   Medicare Important Message given?   (If response is "NO", the following Medicare IM given date fields will be blank) Date Medicare IM given:   Date Additional Medicare IM given:    Discharge Disposition:    Per UR Regulation:    If discussed at Long Length of Stay Meetings, dates discussed:    Comments:  01/10/13 @ 1030.Marland KitchenMarland KitchenOletta Cohn, RN, BSN, [employer] [phone] Spoke with pt at bedside concerning medication assistance. Pt states he paid $152.99 for Plavix refill prescription at [facility].  NCM called [facility] to compare prices- Pharm Tech states that Plavix generic will be $30/30 day supply. Relayed information to pt.Marland KitchenMarland Kitchenpt will consider changing pharmacies.

## 2013-01-10 NOTE — Progress Notes (Signed)
CARDIAC REHAB PHASE I   PRE:  Rate/Rhythm: 89SR  BP:  Supine:   Sitting: 150/80  Standing:    SaO2:   MODE:  Ambulation: 500 ft   POST:  Rate/Rhythm: 104SR  BP:  Supine:   Sitting: 170/74  Standing:    SaO2:  4098-1191 Pt walked 500 ft on RA with hand held asst with fairly steady gait. Denied CP. To chair after walk. Reviewed NTG use, angina, diet. Did not give ex ed or CRP 2 since pt for staged procedure. Refer to cardiologist for activity.   Luetta Nutting, RN BSN  01/10/2013 8:42 AM

## 2013-01-11 NOTE — Discharge Summary (Signed)
Physician Discharge Summary  Patient ID: Ronald English MRN: [id number] DOB/AGE: [birth year] 79 y.o.  Admit date: 01/09/2013 Discharge date: 01/11/2013  Primary Discharge Diagnosis 1. CAD S/P rotational atherectomy and PTCA of the Ostial LAD. H/O MI in 2006: history of known coronary artery and history of RCA stenting in 2006 with a 3.0 x 12 mm Taxus stent and a 2.75 x 24 mm Taxus stent in the distal RCA, mid LAD 3.0 x 12 mm Taxus stent. Distal LAD treated with balloon angioplasty. 2. CYP2C19: Extensive metabolizer. Rec: Plavix for [place]/PCI  Secondary Discharge Diagnosis Hypertension Hyperlipidemia  Significant Diagnostic Studies: 01/09/13:  Procedure performed:  Left heart catheterization including hemodynamic monitoring of the left ventricle, LV gram, selective right and left coronary arteriography. PTCA and of the ostial and proximal LAD with a 1.5 mm atherectomy burr, PTCA and balloon angioplasty of the same region with a 3.0 x 15 mm Trek balloon.  Performed: Femoral arteriogram hemostasis obtained by manual pressure due to calcification of the femoral artery.  Indication patient is a 79 year-old male with history of hypertension (), hyperlipidemia (), history of known coronary artery and history of RCA stenting in 2006 with a 3.0 x 12 mm Taxus stent and a 2.75 x 24 mm Taxus stent in the distal RCA, mid LAD 3.0 x 12 mm Taxus stent. Distal LAD treated with balloon angioplasty. He now presents with class III angina pectoris. Due to his classic presentation, known coronary artery disease, he was brought directly to the cardiac catheterization lab without performing noninvasive testing.  Hemodynamic data:  Left ventricular pressure was 151/5with LVEDP of 19 mm mercury. Aortic pressure was 148/58 with a mean of 94 mm mercury. There was no pressure gradient across the aortic valve.  Left ventricle: Performed in the RAO projection revealed LVEF of 50. Mild global hypokinesis.  Right coronary artery:  Is a moderate to large caliber vessel, with diffuse proximal calcification, previously placed stent in the mid RCA is patent with mild instent restenosis, distal RCA stent is widely patent, between the made RCA stent in the distal RCA stent, the crux has a 80-90% mild calcified stenosis. It is dominant.  Left main coronary artery is large and mildly calcified and bifurcates immediately.  Circumflex coronary artery: A large vessel giving origin to a large obtuse marginal 1, OM 1 has a large second branch which is 2.0-2.5 mm and has a ostial 99% stenosis. OM1 itself has mild luminal irregularity the mid circumflex after the origin of OM1 has a 20-30% stenosis. There is again mild diffuse calcification evident.Marland Kitchen  LAD: Is a moderate to large caliber vessel. Has severe diffuse calcification. The ostial and proximal LAD has a high-grade 99% stenosis. The LAD gives origin to several small to moderate size diagonals diffuse ostial disease. Mid LAD stent that was previously placed is widely patent with mild neointimal hyperplasia. Distal end of the stent shows mild 30-40% instent restenosis. The balloon angioplasty site in the apical LAD is patent.  Interventional data: Successful PTCA and rotational atherectomy of the proximal and ostial LAD with a 1.5 mm rotational burr followed by balloon angioplasty with a 3.0 x 15 mm trek balloon at peak of 12 atmospheric pressure x2. Stenosis reduced from 99% to less than 10%, with brisk TIMI-3 to TIMI-3 flow maintained at the end of the procedure. Stenting was not performed due to heavy calcification, excellent results with atherectomy.   Hospital Course: patient admitted to the hospital for an elective fashion for outpatient coronary angiography  due to class 3 angina pectoris, patient does not want to be on medications, had multivessel enteroplasty in the past.  With a high suspicion for progression of coronary artery disease patient was brought to the cardiac catheterization  lab.  He was found to have a heavily calcified ostial LAD stenosis for which he underwent atherectomy with excellent results.  Patient tolerated the procedure well and the following morning she ambulated and stated he has already started to feel better and he did not have any angina pectoris or shortness of breath or left arm pain with activity that he used to get urine with minimal activity at home.  His groin site had healed well and he was felt stable for discharge.   Recommendations on discharge:    Discharge Exam: Blood pressure 150/80, pulse 98, temperature 98 F (36.7 C), temperature source Oral, resp. rate 20, height 5\' 7"  (1.702 m), weight 51.1 kg (112 lb 10.5 oz), SpO2 97.00%.  General: Moderately built and petitel body habitus who is in no acute distress. Appears stated age. Alert Ox3.   There is no cyanosis. HEENT: normal limits. PERRLA, No JVD.   CARDIAC EXAM: S1, S2 normal, no gallop present. No murmur.   CHEST EXAM: No tenderness of chest wall. LUNGS: Clear to percuss and auscultate.  ABDOMEN: No hepatosplenomegaly. BS normal in all 4 quadrants. Abdomen is non-tender.   EXTREMITY: Full range of movementes, No edema. MUSCULOSKELETAL EXAM: Intact with full range of motion in all 4 extremities.   NEUROLOGIC EXAM: Grossly intact without any focal deficits. Alert O x 3.   VASCULAR EXAM: No skin breakdown. Carotids normal. Extremities: Femoral pulse normal with soft faint bruit left. Popliteal pulse normal ; Pedal pulse normal DP bilateral. Otherwise No prominent pulse felt in the abdomen. No varicose veins. Right groin without hematoma.   Labs:   Lab Results  Component Value Date   WBC 9.7 01/10/2013   HGB 12.8* 01/10/2013   HCT 37.2* 01/10/2013   MCV 89.4 01/10/2013   PLT 133* 01/10/2013    Recent Labs Lab 01/10/13 0526  NA 139  K 4.5  CL 103  CO2 28  BUN 19  CREATININE 1.02  CALCIUM 8.9  GLUCOSE 98    EKG: normal EKG, normal sinus rhythm,  unchanged from previous tracings.   FOLLOW UP PLANS AND APPOINTMENTS    Medication List    TAKE these medications       aspirin 81 MG tablet  Take 1 tablet (81 mg total) by mouth daily.     clopidogrel 75 MG tablet  Commonly known as:  PLAVIX  Take 75 mg by mouth daily.     CO Q-10 PO  Take 1 tablet by mouth 2 (two) times daily.     KELP PO  Take 1 tablet by mouth 2 (two) times daily.     LIVALO 2 MG Tabs  Generic drug:  Pitavastatin Calcium  Take 2 mg by mouth daily.     metoprolol tartrate 25 MG tablet  Commonly known as:  LOPRESSOR  Take 25 mg by mouth 2 (two) times daily.     nitroGLYCERIN 0.4 MG SL tablet  Commonly known as:  NITROSTAT  Place 1 tablet (0.4 mg total) under the tongue every 5 (five) minutes as needed for chest pain.     RIBOFLAVIN PO  Take 1 tablet by mouth 2 (two) times daily.     thiamine 100 MG tablet  Commonly known as:  VITAMIN B-1  Take 100  mg by mouth daily.     thyroid 30 MG tablet  Commonly known as:  ARMOUR  Take 30 mg by mouth daily.           Follow-up Information   Follow up with Pamella Pert, MD On 01/24/2013. (Appointment at 12 noon. Arrive 15 minutes early)    Contact information:   [address] [place] [address] [phone]        Pamella Pert, MD 01/11/2013, 6:24 AM  Pager: [phone] Office: [phone] If no answer: [phone]

## 2014-02-26 ENCOUNTER — Emergency Department (HOSPITAL_COMMUNITY)
Admission: EM | Admit: 2014-02-26 | Discharge: 2014-02-27 | Disposition: A | Discharge status: Home / Self Care | Payer: Medicare Other | Attending: Emergency Medicine | Admitting: Emergency Medicine

## 2014-02-26 ENCOUNTER — Encounter (HOSPITAL_COMMUNITY): Payer: Self-pay | Admitting: Emergency Medicine

## 2014-02-26 ENCOUNTER — Emergency Department (HOSPITAL_COMMUNITY): Payer: Medicare Other

## 2014-02-26 DIAGNOSIS — E871 Hypo-osmolality and hyponatremia: Secondary | ICD-10-CM

## 2014-02-26 DIAGNOSIS — R51 Headache: Secondary | ICD-10-CM | POA: Diagnosis not present

## 2014-02-26 DIAGNOSIS — R5383 Other fatigue: Secondary | ICD-10-CM | POA: Diagnosis not present

## 2014-02-26 DIAGNOSIS — R5381 Other malaise: Secondary | ICD-10-CM | POA: Diagnosis present

## 2014-02-26 LAB — CBC WITH DIFFERENTIAL/PLATELET
BASOS ABS: 0.1 10*3/uL (ref 0.0–0.1)
BASOS PCT: 1 % (ref 0–1)
EOS ABS: 0 10*3/uL (ref 0.0–0.7)
Eosinophils Relative: 0 % (ref 0–5)
HEMATOCRIT: 40 % (ref 39.0–52.0)
HEMOGLOBIN: 14.2 g/dL (ref 13.0–17.0)
LYMPHS PCT: 22 % (ref 12–46)
Lymphs Abs: 1.5 10*3/uL (ref 0.7–4.0)
MCH: 30.9 pg (ref 26.0–34.0)
MCHC: 35.5 g/dL (ref 30.0–36.0)
MCV: 87 fL (ref 78.0–100.0)
MONOS PCT: 9 % (ref 3–12)
Monocytes Absolute: 0.6 10*3/uL (ref 0.1–1.0)
NEUTROS PCT: 68 % (ref 43–77)
Neutro Abs: 4.4 10*3/uL (ref 1.7–7.7)
Platelets: 45 10*3/uL — ABNORMAL LOW (ref 150–400)
RBC: 4.6 MIL/uL (ref 4.22–5.81)
RDW: 13 % (ref 11.5–15.5)
WBC: 6.6 10*3/uL (ref 4.0–10.5)

## 2014-02-26 LAB — COMPREHENSIVE METABOLIC PANEL
ALBUMIN: 3.3 g/dL — AB (ref 3.5–5.2)
ALK PHOS: 61 U/L (ref 39–117)
ALT: 22 U/L (ref 0–53)
AST: 36 U/L (ref 0–37)
Anion gap: 15 (ref 5–15)
BILIRUBIN TOTAL: 0.9 mg/dL (ref 0.3–1.2)
BUN: 33 mg/dL — ABNORMAL HIGH (ref 6–23)
CHLORIDE: 89 meq/L — AB (ref 96–112)
CO2: 22 mEq/L (ref 19–32)
Calcium: 8.9 mg/dL (ref 8.4–10.5)
Creatinine, Ser: 1.11 mg/dL (ref 0.50–1.35)
GFR calc Af Amer: 68 mL/min — ABNORMAL LOW (ref >90)
GFR calc non Af Amer: 59 mL/min — ABNORMAL LOW (ref >90)
GLUCOSE: 107 mg/dL — AB (ref 70–99)
POTASSIUM: 4 meq/L (ref 3.7–5.3)
SODIUM: 126 meq/L — AB (ref 137–147)
TOTAL PROTEIN: 7.1 g/dL (ref 6.0–8.3)

## 2014-02-26 LAB — CBG MONITORING, ED: GLUCOSE-CAPILLARY: 111 mg/dL — AB (ref 70–99)

## 2014-02-26 MED ORDER — SODIUM CHLORIDE 0.9 % IV BOLUS (SEPSIS)
1000.0000 mL | Freq: Once | INTRAVENOUS | Status: AC
  Administered 2014-02-26: 1000 mL via INTRAVENOUS

## 2014-02-26 NOTE — ED Notes (Signed)
Pt's family reports increase weakness and confusion for the past two weeks. Wife states pt sleeps all day and doesn't eat or drink enough. Pt is alert to person only.

## 2014-02-26 NOTE — ED Notes (Signed)
Pt has no facial droop, grips equal and strong, speech is clear.

## 2014-02-27 ENCOUNTER — Encounter (HOSPITAL_COMMUNITY): Payer: Self-pay | Admitting: Radiology

## 2014-02-27 ENCOUNTER — Emergency Department (HOSPITAL_COMMUNITY): Payer: Medicare Other

## 2014-02-27 LAB — URINALYSIS W MICROSCOPIC (NOT AT ARMC)
BILIRUBIN URINE: NEGATIVE
Glucose, UA: NEGATIVE mg/dL
Ketones, ur: 15 mg/dL — AB
Leukocytes, UA: NEGATIVE
Nitrite: NEGATIVE
PH: 5.5 (ref 5.0–8.0)
Protein, ur: 100 mg/dL — AB
SPECIFIC GRAVITY, URINE: 1.021 (ref 1.005–1.030)
Urobilinogen, UA: 1 mg/dL (ref 0.0–1.0)

## 2014-02-27 NOTE — ED Notes (Signed)
See downtime documentation for events prior to [phone]

## 2014-02-27 NOTE — Discharge Instructions (Signed)
Dear Dr. in the next few days. Return for any new or worse symptoms. Make sure that sure that you are drinking and eating well.

## 2014-02-28 ENCOUNTER — Inpatient Hospital Stay (HOSPITAL_COMMUNITY)
Admission: EM | Admit: 2014-02-28 | Discharge: 2014-03-03 | DRG: 064 | Disposition: A | Discharge status: Home Health | Payer: Medicare Other | Attending: Internal Medicine | Admitting: Internal Medicine

## 2014-02-28 ENCOUNTER — Encounter (HOSPITAL_COMMUNITY): Payer: Self-pay | Admitting: Emergency Medicine

## 2014-02-28 ENCOUNTER — Inpatient Hospital Stay (HOSPITAL_COMMUNITY): Payer: Medicare Other

## 2014-02-28 DIAGNOSIS — G92 Toxic encephalopathy: Secondary | ICD-10-CM | POA: Diagnosis present

## 2014-02-28 DIAGNOSIS — N390 Urinary tract infection, site not specified: Secondary | ICD-10-CM | POA: Diagnosis present

## 2014-02-28 DIAGNOSIS — I10 Essential (primary) hypertension: Secondary | ICD-10-CM | POA: Diagnosis present

## 2014-02-28 DIAGNOSIS — Z8639 Personal history of other endocrine, nutritional and metabolic disease: Secondary | ICD-10-CM

## 2014-02-28 DIAGNOSIS — I2109 ST elevation (STEMI) myocardial infarction involving other coronary artery of anterior wall: Secondary | ICD-10-CM

## 2014-02-28 DIAGNOSIS — E871 Hypo-osmolality and hyponatremia: Secondary | ICD-10-CM | POA: Diagnosis present

## 2014-02-28 DIAGNOSIS — R4182 Altered mental status, unspecified: Secondary | ICD-10-CM

## 2014-02-28 DIAGNOSIS — D696 Thrombocytopenia, unspecified: Secondary | ICD-10-CM | POA: Diagnosis present

## 2014-02-28 DIAGNOSIS — I639 Cerebral infarction, unspecified: Secondary | ICD-10-CM | POA: Diagnosis present

## 2014-02-28 DIAGNOSIS — Z85828 Personal history of other malignant neoplasm of skin: Secondary | ICD-10-CM

## 2014-02-28 DIAGNOSIS — I6529 Occlusion and stenosis of unspecified carotid artery: Secondary | ICD-10-CM | POA: Diagnosis present

## 2014-02-28 DIAGNOSIS — I6350 Cerebral infarction due to unspecified occlusion or stenosis of unspecified cerebral artery: Secondary | ICD-10-CM | POA: Diagnosis present

## 2014-02-28 DIAGNOSIS — Z9861 Coronary angioplasty status: Secondary | ICD-10-CM

## 2014-02-28 DIAGNOSIS — E039 Hypothyroidism, unspecified: Secondary | ICD-10-CM | POA: Diagnosis present

## 2014-02-28 DIAGNOSIS — E7800 Pure hypercholesterolemia, unspecified: Secondary | ICD-10-CM | POA: Diagnosis present

## 2014-02-28 DIAGNOSIS — G9340 Encephalopathy, unspecified: Secondary | ICD-10-CM | POA: Diagnosis present

## 2014-02-28 DIAGNOSIS — I2510 Atherosclerotic heart disease of native coronary artery without angina pectoris: Secondary | ICD-10-CM | POA: Diagnosis present

## 2014-02-28 DIAGNOSIS — I252 Old myocardial infarction: Secondary | ICD-10-CM

## 2014-02-28 DIAGNOSIS — Z823 Family history of stroke: Secondary | ICD-10-CM | POA: Diagnosis not present

## 2014-02-28 DIAGNOSIS — Z8679 Personal history of other diseases of the circulatory system: Secondary | ICD-10-CM

## 2014-02-28 DIAGNOSIS — I6523 Occlusion and stenosis of bilateral carotid arteries: Secondary | ICD-10-CM

## 2014-02-28 DIAGNOSIS — Z87891 Personal history of nicotine dependence: Secondary | ICD-10-CM | POA: Diagnosis not present

## 2014-02-28 DIAGNOSIS — Z885 Allergy status to narcotic agent status: Secondary | ICD-10-CM | POA: Diagnosis not present

## 2014-02-28 DIAGNOSIS — G929 Unspecified toxic encephalopathy: Secondary | ICD-10-CM | POA: Diagnosis present

## 2014-02-28 LAB — COMPREHENSIVE METABOLIC PANEL
ALBUMIN: 3.1 g/dL — AB (ref 3.5–5.2)
ALK PHOS: 52 U/L (ref 39–117)
ALT: 18 U/L (ref 0–53)
AST: 26 U/L (ref 0–37)
Anion gap: 13 (ref 5–15)
BILIRUBIN TOTAL: 0.8 mg/dL (ref 0.3–1.2)
BUN: 27 mg/dL — ABNORMAL HIGH (ref 6–23)
CHLORIDE: 91 meq/L — AB (ref 96–112)
CO2: 24 mEq/L (ref 19–32)
CREATININE: 0.99 mg/dL (ref 0.50–1.35)
Calcium: 8.7 mg/dL (ref 8.4–10.5)
GFR calc Af Amer: 84 mL/min — ABNORMAL LOW (ref >90)
GFR calc non Af Amer: 73 mL/min — ABNORMAL LOW (ref >90)
Glucose, Bld: 104 mg/dL — ABNORMAL HIGH (ref 70–99)
POTASSIUM: 3.9 meq/L (ref 3.7–5.3)
SODIUM: 128 meq/L — AB (ref 137–147)
Total Protein: 6.4 g/dL (ref 6.0–8.3)

## 2014-02-28 LAB — URINALYSIS, ROUTINE W REFLEX MICROSCOPIC
Bilirubin Urine: NEGATIVE
GLUCOSE, UA: NEGATIVE mg/dL
Ketones, ur: 15 mg/dL — AB
Leukocytes, UA: NEGATIVE
NITRITE: NEGATIVE
Protein, ur: NEGATIVE mg/dL
Specific Gravity, Urine: 1.016 (ref 1.005–1.030)
Urobilinogen, UA: 1 mg/dL (ref 0.0–1.0)
pH: 6 (ref 5.0–8.0)

## 2014-02-28 LAB — URINE MICROSCOPIC-ADD ON

## 2014-02-28 LAB — CBC
HCT: 37.8 % — ABNORMAL LOW (ref 39.0–52.0)
HEMATOCRIT: 32.8 % — AB (ref 39.0–52.0)
Hemoglobin: 13.1 g/dL (ref 13.0–17.0)
Hemoglobin: 11.3 g/dL — ABNORMAL LOW (ref 13.0–17.0)
MCH: 30.2 pg (ref 26.0–34.0)
MCH: 29.5 pg (ref 26.0–34.0)
MCHC: 34.7 g/dL (ref 30.0–36.0)
MCHC: 34.5 g/dL (ref 30.0–36.0)
MCV: 87.1 fL (ref 78.0–100.0)
MCV: 85.6 fL (ref 78.0–100.0)
PLATELETS: 66 10*3/uL — AB (ref 150–400)
Platelets: 66 10*3/uL — ABNORMAL LOW (ref 150–400)
RBC: 4.34 MIL/uL (ref 4.22–5.81)
RBC: 3.83 MIL/uL — ABNORMAL LOW (ref 4.22–5.81)
RDW: 13 % (ref 11.5–15.5)
RDW: 13.1 % (ref 11.5–15.5)
WBC: 5.3 10*3/uL (ref 4.0–10.5)
WBC: 4.6 10*3/uL (ref 4.0–10.5)

## 2014-02-28 LAB — TROPONIN I: Troponin I: <0.3 ng/mL (ref <0.30)

## 2014-02-28 LAB — AMMONIA: Ammonia: 14 umol/L (ref 11–60)

## 2014-02-28 LAB — PRO B NATRIURETIC PEPTIDE: PRO B NATRI PEPTIDE: 772.4 pg/mL — AB (ref 0–450)

## 2014-02-28 LAB — CBG MONITORING, ED: GLUCOSE-CAPILLARY: 87 mg/dL (ref 70–99)

## 2014-02-28 LAB — CREATININE, SERUM
Creatinine, Ser: 0.92 mg/dL (ref 0.50–1.35)
GFR calc Af Amer: 87 mL/min — ABNORMAL LOW (ref >90)
GFR, EST NON AFRICAN AMERICAN: 75 mL/min — AB (ref >90)

## 2014-02-28 LAB — TSH: TSH: 6.76 u[IU]/mL — ABNORMAL HIGH (ref 0.350–4.500)

## 2014-02-28 MED ORDER — KELP 0.15 MG PO TABS: 1.0000 | ORAL_TABLET | Freq: Every day | ORAL | Status: DC

## 2014-02-28 MED ORDER — SODIUM CHLORIDE 0.9 % IJ SOLN
3.0000 mL | Freq: Two times a day (BID) | INTRAMUSCULAR | Status: DC
  Administered 2014-02-28 – 2014-03-02 (×4): 3 mL via INTRAVENOUS

## 2014-02-28 MED ORDER — HEPARIN SODIUM (PORCINE) 5000 UNIT/ML IJ SOLN: 5000.0000 [IU] | Freq: Three times a day (TID) | INTRAMUSCULAR | Status: DC

## 2014-02-28 MED ORDER — NITROGLYCERIN 0.4 MG SL SUBL: 0.4000 mg | SUBLINGUAL_TABLET | SUBLINGUAL | Status: DC | PRN

## 2014-02-28 MED ORDER — SODIUM CHLORIDE 0.9 % IV SOLN
INTRAVENOUS | Status: DC
  Administered 2014-02-28 – 2014-03-01 (×2): via INTRAVENOUS

## 2014-02-28 MED ORDER — SODIUM CHLORIDE 0.9 % IV BOLUS (SEPSIS)
500.0000 mL | Freq: Once | INTRAVENOUS | Status: AC
  Administered 2014-02-28: 500 mL via INTRAVENOUS

## 2014-02-28 MED ORDER — THYROID 30 MG PO TABS
30.0000 mg | ORAL_TABLET | Freq: Every day | ORAL | Status: DC
  Administered 2014-02-28 – 2014-03-03 (×4): 30 mg via ORAL
  Filled 2014-02-28 (×4): qty 1

## 2014-02-28 NOTE — Progress Notes (Signed)
PHARMACIST - PHYSICIAN ORDER COMMUNICATION  CONCERNING: P&T Medication Policy on Herbal Medications  DESCRIPTION:  This patient's order for:  kelp  has been noted.  This product(s) is classified as an "herbal" or natural product. Due to a lack of definitive safety studies or FDA approval, nonstandard manufacturing practices, plus the potential risk of unknown drug-drug interactions while on inpatient medications, the Pharmacy and Therapeutics Committee does not permit the use of "herbal" or natural products of this type within [facility].   ACTION TAKEN: The pharmacy department is unable to verify this order at this time and your patient has been informed of this safety policy. Please reevaluate patient's clinical condition at discharge and address if the herbal or natural product(s) should be resumed at that time.  Erin Hearing PharmD., BCPS Clinical Pharmacist Pager [phone] 02/28/2014 11:14 PM
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
## 2014-02-28 NOTE — ED Notes (Signed)
Ordered [facility] for diet order

## 2014-02-28 NOTE — ED Notes (Signed)
Asked patient if he could provide urine sample but he is unable to comply at this time.

## 2014-02-28 NOTE — ED Notes (Signed)
Pt was seen here two nights ago with confusion, decreased appetite, sleepiness.  Dr. Nyoka Cowden called and felt the patient needs further work-up for stroke.  Pt was diagnosed with uti and low sodium at the ER.  Dr. Nyoka Cowden reported left facial weakness, not seen at triage.  Pt knows name and birthday, does not know month or year.  Pt alert and weak.  Pt denies any pain.  Dr. Nyoka Cowden requests a thyroid panel.

## 2014-02-28 NOTE — ED Notes (Signed)
Pts family remains at bedside. Pt placed into gown and on monitor. Pt monitored by 5 lead, blood pressure and pulse ox.
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
## 2014-03-01 DIAGNOSIS — I6789 Other cerebrovascular disease: Secondary | ICD-10-CM

## 2014-03-01 DIAGNOSIS — Z8639 Personal history of other endocrine, nutritional and metabolic disease: Secondary | ICD-10-CM

## 2014-03-01 DIAGNOSIS — G9340 Encephalopathy, unspecified: Secondary | ICD-10-CM

## 2014-03-01 DIAGNOSIS — Z862 Personal history of diseases of the blood and blood-forming organs and certain disorders involving the immune mechanism: Secondary | ICD-10-CM

## 2014-03-01 DIAGNOSIS — I639 Cerebral infarction, unspecified: Secondary | ICD-10-CM | POA: Diagnosis present

## 2014-03-01 DIAGNOSIS — E7800 Pure hypercholesterolemia, unspecified: Secondary | ICD-10-CM

## 2014-03-01 LAB — COMPREHENSIVE METABOLIC PANEL
ALBUMIN: 2.6 g/dL — AB (ref 3.5–5.2)
ALK PHOS: 44 U/L (ref 39–117)
ALT: 15 U/L (ref 0–53)
ANION GAP: 12 (ref 5–15)
AST: 23 U/L (ref 0–37)
BILIRUBIN TOTAL: 0.6 mg/dL (ref 0.3–1.2)
BUN: 24 mg/dL — ABNORMAL HIGH (ref 6–23)
CHLORIDE: 98 meq/L (ref 96–112)
CO2: 24 mEq/L (ref 19–32)
Calcium: 8.2 mg/dL — ABNORMAL LOW (ref 8.4–10.5)
Creatinine, Ser: 0.88 mg/dL (ref 0.50–1.35)
GFR calc Af Amer: 88 mL/min — ABNORMAL LOW (ref >90)
GFR calc non Af Amer: 76 mL/min — ABNORMAL LOW (ref >90)
Glucose, Bld: 93 mg/dL (ref 70–99)
POTASSIUM: 3.7 meq/L (ref 3.7–5.3)
SODIUM: 134 meq/L — AB (ref 137–147)
TOTAL PROTEIN: 5.6 g/dL — AB (ref 6.0–8.3)

## 2014-03-01 LAB — OSMOLALITY, URINE: Osmolality, Ur: 531 mOsm/kg (ref 390–1090)

## 2014-03-01 LAB — CBC WITH DIFFERENTIAL/PLATELET
BASOS ABS: 0.1 10*3/uL (ref 0.0–0.1)
BASOS PCT: 2 % — AB (ref 0–1)
EOS ABS: 0 10*3/uL (ref 0.0–0.7)
Eosinophils Relative: 1 % (ref 0–5)
HEMATOCRIT: 34.1 % — AB (ref 39.0–52.0)
HEMOGLOBIN: 11.8 g/dL — AB (ref 13.0–17.0)
LYMPHS ABS: 1.9 10*3/uL (ref 0.7–4.0)
LYMPHS PCT: 42 % (ref 12–46)
MCH: 29.6 pg (ref 26.0–34.0)
MCHC: 34.6 g/dL (ref 30.0–36.0)
MCV: 85.5 fL (ref 78.0–100.0)
MONO ABS: 0.5 10*3/uL (ref 0.1–1.0)
Monocytes Relative: 10 % (ref 3–12)
Neutro Abs: 2 10*3/uL (ref 1.7–7.7)
Neutrophils Relative %: 45 % (ref 43–77)
Platelets: 73 10*3/uL — ABNORMAL LOW (ref 150–400)
RBC: 3.99 MIL/uL — ABNORMAL LOW (ref 4.22–5.81)
RDW: 13 % (ref 11.5–15.5)
WBC: 4.5 10*3/uL (ref 4.0–10.5)

## 2014-03-01 LAB — TROPONIN I
Troponin I: <0.3 ng/mL (ref <0.30)
Troponin I: <0.3 ng/mL (ref <0.30)

## 2014-03-01 LAB — OSMOLALITY: Osmolality: 270 mOsm/kg — ABNORMAL LOW (ref 275–300)

## 2014-03-01 LAB — T4, FREE: FREE T4: 0.88 ng/dL (ref 0.80–1.80)

## 2014-03-01 LAB — HEMOGLOBIN A1C
HEMOGLOBIN A1C: 6 % — AB (ref <5.7)
Mean Plasma Glucose: 126 mg/dL — ABNORMAL HIGH (ref <117)

## 2014-03-01 LAB — SAVE SMEAR

## 2014-03-01 MED ORDER — ASPIRIN 325 MG PO TABS
325.0000 mg | ORAL_TABLET | Freq: Every day | ORAL | Status: DC
  Filled 2014-03-01 (×2): qty 1

## 2014-03-01 MED ORDER — ASPIRIN EC 81 MG PO TBEC: 81.0000 mg | DELAYED_RELEASE_TABLET | Freq: Every day | ORAL | Status: DC

## 2014-03-01 NOTE — Progress Notes (Signed)
PROGRESS NOTE  Ronald English YWV:[id number] DOB: 1925-12-09 DOA: 02/28/2014 PCP: Criselda Peaches, MD  Assessment/Plan: Encephalopathy  -clinically dry on exam  -hydrate pt  -ammonia level ok -MRI brain- + small lesion- consult neuro, add ASA -panculture  -TSH mildly elevated- on armour thyroid -check free t4  CAD/HTN  -no active CP currently  -cycle CEs  -continue home regimen  -check pro BNP- mildly elevated   Hyponatremia  -likely secondary to dehydration  -improved, d/c IVF  Thrombocytopenia  -Unclear etiology  -improved   Code Status: full Family Communication: no answer when I called wife Disposition Plan:    Consultants:    Procedures:      HPI/Subjective: No new c/o  Not sure where his wife went  Objective: Filed Vitals:   03/01/14 0346  BP: 101/52  Pulse: 80  Temp: 97.6 F (36.4 C)  Resp: 18    Intake/Output Summary (Last 24 hours) at 03/01/14 1150 Last data filed at 03/01/14 0400  Gross per 24 hour  Intake      0 ml  Output    450 ml  Net   -450 ml   Filed Weights   02/28/14 2243 03/01/14 0346  Weight: 48.4 kg (106 lb 11.2 oz) 48.3 kg (106 lb 7.7 oz)    Exam:   General:  A+Ox3, NAD  Cardiovascular: rrr  Respiratory: clear  Abdomen: +BS, soft  Musculoskeletal: moves all 4 ext   Data Reviewed: Basic Metabolic Panel:  Recent Labs Lab 02/26/14 2152 02/28/14 1319 02/28/14 1849 03/01/14 0030  NA 126* 128*  --  134*  K 4.0 3.9  --  3.7  CL 89* 91*  --  98  CO2 22 24  --  24  GLUCOSE 107* 104*  --  93  BUN 33* 27*  --  24*  CREATININE 1.11 0.99 0.92 0.88  CALCIUM 8.9 8.7  --  8.2*   Liver Function Tests:  Recent Labs Lab 02/26/14 2152 02/28/14 1319 03/01/14 0030  AST 36 26 23  ALT 22 18 15   ALKPHOS 61 52 44  BILITOT 0.9 0.8 0.6  PROT 7.1 6.4 5.6*  ALBUMIN 3.3* 3.1* 2.6*   No results found for this basename: LIPASE, AMYLASE,  in the last 168 hours  Recent Labs Lab 02/28/14 1854  AMMONIA 14    CBC:  Recent Labs Lab 02/26/14 2152 02/28/14 1319 02/28/14 1849 03/01/14 0030  WBC 6.6 5.3 4.6 4.5  NEUTROABS 4.4  --   --  2.0  HGB 14.2 13.1 11.3* 11.8*  HCT 40.0 37.8* 32.8* 34.1*  MCV 87.0 87.1 85.6 85.5  PLT 45* 66* 66* 73*   Cardiac Enzymes:  Recent Labs Lab 02/28/14 1822 02/28/14 1853 03/01/14 0030 03/01/14 0644  TROPONINI <0.30 <0.30 <0.30 <0.30   BNP (last 3 results)  Recent Labs  02/28/14 1853  PROBNP 772.4*   CBG:  Recent Labs Lab 02/26/14 2141 02/28/14 1331  GLUCAP 111* 87    No results found for this or any previous visit (from the past 240 hour(s)).   Studies: Dg Orbits  02/28/2014   CLINICAL DATA:  Pre MRI screening.  EXAM: ORBITS - COMPLETE 4+ VIEW  COMPARISON:  None.  FINDINGS: There is no evidence of fracture or other significant bone abnormality. No orbital emphysema or sinus air-fluid levels are seen. No radiopaque foreign body.  IMPRESSION: Negative.   Electronically Signed   By: Dereck Ligas M.D.   On: 02/28/2014 20:37   Dg Chest 2 View  02/28/2014   CLINICAL DATA:  Confusion.  Dizziness.  EXAM: CHEST  2 VIEW  COMPARISON:  02/26/2014.  FINDINGS: Lower lung volumes are present. The cardiopericardial silhouette appears within normal limits. Aortic arch atherosclerosis. Chondrocalcinosis is noted in both humeral heads. There is no airspace disease. Chronic thoracic compression fractures appear unchanged compared to prior. No focal consolidation. No pleural effusion. Cardiac stent noted on the lateral view.  IMPRESSION: No acute cardiopulmonary disease. Lower lung volumes than on prior with underlying emphysema.   Electronically Signed   By: Dereck Ligas M.D.   On: 02/28/2014 20:37   Mr Brain Wo Contrast  02/28/2014   CLINICAL DATA:  Encephalopathy.  EXAM: MRI HEAD WITHOUT CONTRAST  TECHNIQUE: Multiplanar, multiecho pulse sequences of the brain and surrounding structures were obtained without intravenous contrast.  COMPARISON:  Prior CT  from 02/27/2014  FINDINGS: Small 5 mm focus of restricted diffusion seen within the deep white matter of the right centrum semi ovale (series 4, image 24) most consistent with a probable small acute ischemic infarct. No associated hemorrhage or significant mass effect. No other acute intracranial infarct. Normal flow voids seen within the intracranial vasculature. Cavernous segments of the internal carotid arteries are mildly ectatic without aneurysm.  Diffuse prominence of the CSF containing spaces is compatible with generalized cerebral atrophy. Scattered and confluent T2/FLAIR hyperintensity within the periventricular and deep white matter both cerebral hemispheres is most consistent with chronic small vessel ischemic changes.  No mass or midline shift. Ventricular prominence related to global cerebral parenchymal volume loss present. No hydrocephalus. No extra-axial fluid collection.  No acute abnormality seen about either orbit.  Paranasal sinuses and mastoid air cells are clear.  Calvarium demonstrates a normal appearance with normal signal intensity. Scalp soft tissues are unremarkable.  IMPRESSION: 1. 5 mm mildly intense focus of restricted diffusion within the white matter of the right centrum semi ovale, suspicious for a small acute ischemic infarct. No associated hemorrhage or significant mass effect. 2. No other acute intracranial abnormality identified. 3. Advanced cerebral atrophy with chronic small vessel ischemic disease.   Electronically Signed   By: Jeannine Boga M.D.   On: 02/28/2014 23:09    Scheduled Meds: . aspirin  325 mg Oral Daily  . sodium chloride  3 mL Intravenous Q12H  . thyroid  30 mg Oral Daily   Continuous Infusions:   Antibiotics Given (last 72 hours)   None      Active Problems:   Hypercholesterolemia   Carotid stenosis   Encephalopathy   CVA (cerebral infarction)    Time spent: 35 min    VANN, [facility] Pager [phone]. If  7PM-7AM, please contact night-coverage at [email or web address], password [facility] 03/01/2014, 11:50 AM  LOS: 1 day
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
## 2014-03-01 NOTE — Progress Notes (Signed)
*  PRELIMINARY RESULTS* Vascular Ultrasound Carotid Duplex (Doppler) has been completed.  Preliminary findings: Right = 40-59% ICA stenosis, based on velocities. However, systolic velocity doubles from previous segment, suggesting a more significant stenosis. Left = 40-59% ICA stenosis.  Vertebral artery flow is antegrade.      Landry Mellow, RDMS, RVT  03/01/2014, 4:10 PM

## 2014-03-01 NOTE — Progress Notes (Signed)
  Echocardiogram 2D Echocardiogram has been performed.  Ronald English 03/01/2014, 4:42 PM

## 2014-03-01 NOTE — Progress Notes (Signed)
Utilization Review Completed.Ronald English T8/14/2015  
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
## 2014-03-01 NOTE — Progress Notes (Signed)
PT Cancellation Note  Patient Details Name: Ronald English MRN: [id number] DOB: 1926/04/25   Cancelled Treatment:    Reason Eval/Treat Not Completed: Patient at procedure or test/unavailable (On way to vascular lab)   INGOLD,Jacky Dross 03/01/2014, 3:44 PM [facility] [phone] (pager)

## 2014-03-02 ENCOUNTER — Inpatient Hospital Stay (HOSPITAL_COMMUNITY): Payer: Medicare Other

## 2014-03-02 ENCOUNTER — Encounter (HOSPITAL_COMMUNITY): Payer: Self-pay | Admitting: Radiology

## 2014-03-02 DIAGNOSIS — I6350 Cerebral infarction due to unspecified occlusion or stenosis of unspecified cerebral artery: Principal | ICD-10-CM

## 2014-03-02 DIAGNOSIS — I6529 Occlusion and stenosis of unspecified carotid artery: Secondary | ICD-10-CM

## 2014-03-02 DIAGNOSIS — I658 Occlusion and stenosis of other precerebral arteries: Secondary | ICD-10-CM

## 2014-03-02 LAB — COMPREHENSIVE METABOLIC PANEL
ALBUMIN: 2.4 g/dL — AB (ref 3.5–5.2)
ALK PHOS: 42 U/L (ref 39–117)
ALT: 12 U/L (ref 0–53)
AST: 19 U/L (ref 0–37)
Anion gap: 11 (ref 5–15)
BUN: 19 mg/dL (ref 6–23)
CHLORIDE: 103 meq/L (ref 96–112)
CO2: 22 mEq/L (ref 19–32)
Calcium: 8.1 mg/dL — ABNORMAL LOW (ref 8.4–10.5)
Creatinine, Ser: 0.81 mg/dL (ref 0.50–1.35)
GFR calc Af Amer: >90 mL/min (ref >90)
GFR, EST NON AFRICAN AMERICAN: 79 mL/min — AB (ref >90)
Glucose, Bld: 95 mg/dL (ref 70–99)
POTASSIUM: 3.8 meq/L (ref 3.7–5.3)
Sodium: 136 mEq/L — ABNORMAL LOW (ref 137–147)
Total Bilirubin: 0.6 mg/dL (ref 0.3–1.2)
Total Protein: 5.1 g/dL — ABNORMAL LOW (ref 6.0–8.3)

## 2014-03-02 LAB — CBC WITH DIFFERENTIAL/PLATELET
Basophils Absolute: 0.1 10*3/uL (ref 0.0–0.1)
Basophils Relative: 1 % (ref 0–1)
EOS ABS: 0.1 10*3/uL (ref 0.0–0.7)
Eosinophils Relative: 1 % (ref 0–5)
HEMATOCRIT: 34.3 % — AB (ref 39.0–52.0)
Hemoglobin: 11.8 g/dL — ABNORMAL LOW (ref 13.0–17.0)
Lymphocytes Relative: 38 % (ref 12–46)
Lymphs Abs: 2.2 10*3/uL (ref 0.7–4.0)
MCH: 29.9 pg (ref 26.0–34.0)
MCHC: 34.4 g/dL (ref 30.0–36.0)
MCV: 87.1 fL (ref 78.0–100.0)
MONO ABS: 0.6 10*3/uL (ref 0.1–1.0)
Monocytes Relative: 11 % (ref 3–12)
NEUTROS ABS: 2.7 10*3/uL (ref 1.7–7.7)
Neutrophils Relative %: 49 % (ref 43–77)
PLATELETS: 105 10*3/uL — AB (ref 150–400)
RBC: 3.94 MIL/uL — ABNORMAL LOW (ref 4.22–5.81)
RDW: 13.1 % (ref 11.5–15.5)
WBC: 5.7 10*3/uL (ref 4.0–10.5)

## 2014-03-02 LAB — URINE CULTURE
COLONY COUNT: NO GROWTH
Culture: NO GROWTH

## 2014-03-02 LAB — LIPID PANEL
CHOLESTEROL: 137 mg/dL (ref 0–200)
HDL: 18 mg/dL — ABNORMAL LOW (ref >39)
LDL CALC: 90 mg/dL (ref 0–99)
TRIGLYCERIDES: 147 mg/dL (ref <150)
Total CHOL/HDL Ratio: 7.6 RATIO
VLDL: 29 mg/dL (ref 0–40)

## 2014-03-02 MED ORDER — CLOPIDOGREL BISULFATE 75 MG PO TABS
75.0000 mg | ORAL_TABLET | Freq: Every day | ORAL | Status: DC
  Administered 2014-03-02 – 2014-03-03 (×2): 75 mg via ORAL
  Filled 2014-03-02 (×2): qty 1

## 2014-03-02 MED ORDER — IOHEXOL 350 MG/ML SOLN
50.0000 mL | Freq: Once | INTRAVENOUS | Status: AC | PRN
  Administered 2014-03-02: 50 mL via INTRAVENOUS

## 2014-03-02 NOTE — Progress Notes (Signed)
Please contact the patients son - Hever Castilleja at [phone] with the results ALL test results (esp CT scan) that were completed on Saturday 03/02/14 Ronald English 03/02/2014 7:43 PM

## 2014-03-02 NOTE — Evaluation (Signed)
Occupational Therapy Evaluation Patient Details Name: Ronald English MRN: [id number] DOB: 06-23-1926 Today's Date: 03/02/2014    History of Present Illness Pt adm with encephalopathy. MRI - 5 mm mildly intense focus of restricted diffusion within the white matter of the right centrum semi ovale, suspicious for a small acute ischemic infarct. PMH -  CAD, MI   Clinical Impression   This 80 yo male admitted with above presents to acute OT at a S level and has this at home. Acute OT will sign off.   Follow Up Recommendations  No OT follow up    Equipment Recommendations  None recommended by OT       Precautions / Restrictions Precautions Precautions: Fall Restrictions Weight Bearing Restrictions: No      Mobility Bed Mobility               General bed mobility comments: Pt up in recliner upon arrival just finishing up with PT  Transfers Overall transfer level: Needs assistance Equipment used: None Transfers: Sit to/from Stand Sit to Stand: S            Balance Overall balance assessment: Needs assistance Sitting-balance support: No upper extremity supported;Feet supported Sitting balance-Leahy Scale: Good     Standing balance support: No upper extremity supported Standing balance-Leahy Scale: Fair                              ADL                                         General ADL Comments: S for all BADLs               Pertinent Vitals/Pain Pain Assessment: No/denies pain     Hand Dominance Right   Extremity/Trunk Assessment Upper Extremity Assessment Upper Extremity Assessment: Overall WFL for tasks assessed         Communication Communication Communication: HOH   Cognition Arousal/Alertness: Awake/alert Behavior During Therapy: WFL for tasks assessed/performed                                  Home Living Family/patient expects to be discharged to:: Private residence Living Arrangements:  Spouse/significant other Available Help at Discharge: Family;Available 24 hours/day Type of Home: House Home Access: Stairs to enter [employer] of Steps: 1   Home Layout: One level     Bathroom Shower/Tub: Walk-in shower;Door Shower/tub characteristics: Door [employer]: Standard     Home Equipment: Shower seat (if needed)   Additional Comments: May have access to a walker.      Prior Functioning/Environment Level of Independence: Independent        Comments: Does alot of gardening             OT Goals(Current goals can be found in the care plan section) Acute Rehab OT Goals Patient Stated Goal: Return home  OT Frequency:                End of Session Equipment Utilized During Treatment:  (None)  Activity Tolerance: Patient tolerated treatment well Patient left: in chair;with call bell/phone within reach;with chair alarm set;with family/visitor present   Time: 3614-4315 OT Time Calculation (min): 22 min Charges:  OT General Charges $OT Visit: 1 Procedure OT Evaluation $  Initial OT Evaluation Tier I: 1 Procedure OT Treatments $Self Care/Home Management : 8-22 mins  Ronald English [phone] 03/02/2014, 11:56 AM

## 2014-03-02 NOTE — Progress Notes (Signed)
Stroke Team Progress Note  HISTORY Ronald English is an 80 y.o. male with significant past medical history of CAD s/p MI, HTN, hypothyroidism, admitted to [facility] due to altered mental status/confusion and concerns for stroke.  At this time he complains of generalized weakness but is obviously confused and thus all information is obtained from the [facility].  He initially presented to the ED 2 0r 3 days before this admission with symptoms of generalized weakness, decreased appetite and fatigue over past 2-3 weeks, and workup was essentially negative apart from ? UTI and hyponatremia/dehydration.Had follow up with PCP about sxs earlier today. PCP was concerned about possible stroke and was directed back to ER for further evaluation.  MRI brain revealed a 5 mm mildly intense focus of restricted diffusion within the white matter of the right centrum semi ovale, suspicious for a small acute ischemic infarct. Otherwise, serologies, UA are unrevealing.  Denies HA, vertigo, double vision, difficulty swallowing, focal weakness or numbness, slurred speech, language or vision impairment.   Date last known well: unable to determine  Time last known well: unable to determine  tPA Given: no, late presentation    SUBJECTIVE Multiple family members present. The patient is much improved. The family feels that he is essentially back to baseline. The patient requests that aspirin be changed to Plavix. He feels that aspirin causes him to have visual problems.  OBJECTIVE Most recent Vital Signs: Filed Vitals:   03/01/14 1514 03/01/14 2015 03/02/14 0537 03/02/14 0542  BP: 145/69 132/58 132/55   Pulse: 60 61 79   Temp: 97.9 F (36.6 C)  99 F (37.2 C)   TempSrc: Oral Oral Oral   Resp: 16 18 18    Height:      Weight:    109 lb 1.6 oz (49.487 kg)  SpO2: 99% 99% 96%    CBG (last 3)   Recent Labs  02/28/14 1331  GLUCAP 87    IV Fluid Intake:     MEDICATIONS  . aspirin  325 mg Oral Daily  . sodium chloride   3 mL Intravenous Q12H  . thyroid  30 mg Oral Daily   PRN:  nitroGLYCERIN  Diet:  Cardiac thin liquids Activity:   DVT Prophylaxis:  SCDs  CLINICALLY SIGNIFICANT STUDIES Basic Metabolic Panel:  Recent Labs Lab 03/01/14 0030 03/02/14 0054  NA 134* 136*  K 3.7 3.8  CL 98 103  CO2 24 22  GLUCOSE 93 95  BUN 24* 19  CREATININE 0.88 0.81  CALCIUM 8.2* 8.1*   Liver Function Tests:  Recent Labs Lab 03/01/14 0030 03/02/14 0054  AST 23 19  ALT 15 12  ALKPHOS 44 42  BILITOT 0.6 0.6  PROT 5.6* 5.1*  ALBUMIN 2.6* 2.4*   CBC:  Recent Labs Lab 03/01/14 0030 03/02/14 0054  WBC 4.5 5.7  NEUTROABS 2.0 2.7  HGB 11.8* 11.8*  HCT 34.1* 34.3*  MCV 85.5 87.1  PLT 73* 105*   Coagulation: No results found for this basename: LABPROT, INR,  in the last 168 hours Cardiac Enzymes:  Recent Labs Lab 02/28/14 1853 03/01/14 0030 03/01/14 0644  TROPONINI <0.30 <0.30 <0.30   Urinalysis:  Recent Labs Lab 02/27/14 0050 02/28/14 2243  COLORURINE AMBER* YELLOW  LABSPEC 1.021 1.016  PHURINE 5.5 6.0  GLUCOSEU NEGATIVE NEGATIVE  HGBUR SMALL* SMALL*  BILIRUBINUR NEGATIVE NEGATIVE  KETONESUR 15* 15*  PROTEINUR 100* NEGATIVE  UROBILINOGEN 1.0 1.0  NITRITE NEGATIVE NEGATIVE  LEUKOCYTESUR NEGATIVE NEGATIVE   Lipid Panel  Component Value Date/Time   CHOL 137 03/02/2014 0054   TRIG 147 03/02/2014 0054   HDL 18* 03/02/2014 0054   CHOLHDL 7.6 03/02/2014 0054   VLDL 29 03/02/2014 0054   LDLCALC 90 03/02/2014 0054   HgbA1C  Lab Results  Component Value Date   HGBA1C 6.0* 03/01/2014    Urine Drug Screen:   No results found for this basename: labopia, cocainscrnur, labbenz, amphetmu, thcu, labbarb    Alcohol Level: No results found for this basename: ETH,  in the last 168 hours  Dg Orbits 02/28/2014    Negative.     Dg Chest 2 View 02/28/2014    No acute cardiopulmonary disease. Lower lung volumes than on prior with underlying emphysema.      Mr Brain Wo Contrast 02/28/2014     1. 5 mm mildly intense focus of restricted diffusion within the white matter of the right centrum semi ovale, suspicious for a small acute ischemic infarct. No associated hemorrhage or significant mass effect.  2. No other acute intracranial abnormality identified.  3. Advanced cerebral atrophy with chronic small vessel ischemic disease.       CT of the brain   02/27/2014 1. No acute intracranial abnormalities.  2. Mild cerebral atrophy with extensive chronic microvascular ischemic changes throughout the deep and periventricular white  matter of the cerebral hemispheres bilaterally.  MRA of the brain    Carotid Doppler - Preliminary findings: Right = 40-59% ICA stenosis, based on velocities. However, systolic velocity doubles from previous segment, suggesting a more significant stenosis. Left = 40-59% ICA stenosis. Vertebral artery flow is antegrade.    2D Echocardiogram - ejection fraction 55-60%. No cardiac source of emboli identified.     EKG - NSR rate 75 BPM. - For complete results please see formal report.   Therapy Recommendations - home health physical therapy recommended   Physical Exam    Mental Status:  Alert, oriented, knows he is in the hospital. Speech fluent without evidence of aphasia. Able to follow 3 step commands without difficulty.  Cranial Nerves:  II: Discs flat bilaterally; Visual fields grossly normal, pupils equal, round, reactive to light and accommodation  III,IV, VI: ptosis not present, extra-ocular motions intact bilaterally  V,VII: smile symmetric, facial light touch sensation normal bilaterally  VIII: hearing normal bilaterally  IX,X: gag reflex present  XI: bilateral shoulder shrug  XII: midline tongue extension without atrophy or fasciculations  Motor:  Right : Upper extremity 5/5 Left: Upper extremity 4+/5 Lower extremity 5/5 Lower extremity 5/5  Tone and bulk:normal tone throughout; no atrophy noted  Sensory: Pinprick and light touch  intact throughout, bilaterally  Deep Tendon Reflexes:  Right: Upper Extremity Left: Upper extremity  biceps (C-5 to C-6) 2/4 biceps (C-5 to C-6) 2/4  tricep (C7) 2/4 triceps (C7) 2/4  Brachioradialis (C6) 2/4 Brachioradialis (C6) 2/4  Lower Extremity Lower Extremity  quadriceps (L-2 to L-4) 2/4 quadriceps (L-2 to L-4) 2/4  Achilles (S1) 2/4 Achilles (S1) 2/4  Plantars:  Right: downgoing Left: downgoing  Cerebellar:  normal finger-to-nose, normal heel-to-shin test  Gait:  Not tested   ASSESSMENT Mr. Ronald English is a 80 y.o. male presenting with generalized weakness and altered mental status. T-PA therapy was not initiated as time of onset was unknown. MRI -5 mm mildly intense focus of restricted diffusion within the white matter of the right centrum semi ovale, suspicious for a small acute ischemic infarct.  Infarct felt secondary to small vessel disease. On no antithrombotics prior  to admission. Now on Plavix 75 mg daily.  for secondary stroke prevention. Patient with resultant resolution of deficits except for very mild left upper extremity weakness.. Stroke work up underway.   History of previous stroke  Cholesterol 137; LDL 90 (no statin prior to admission)  Hemoglobin A1c - 6.0  Right internal carotid artery stenosis  Coronary artery disease with previous MI  Hypertension  Hypothyroidism  Hyponatremia - Na 136  Aspirin intolerance ( visual problems ) changed to Plavix per patient's request.   Hospital day # 2  TREATMENT/PLAN  Continue aspirin 325 mg orally every day for secondary stroke prevention.  Home health physical therapy recommended  CTA of the head and neck to evaluate for atherosclerosis.  Possible discharge later today.   Followup with Dr.Xu in 4 weeks.  Mikey Bussing PA-C [facility] Pager [phone] 03/02/2014, 11:53 AM   SIGNED  CTA done, showing no hemodynamic stenosis.  Will sign off/ Can be d/ crom Neuro stand  point. Leotis Pain This patient requires constant monitoring of vital signs, hemodynamics,respiratory and cardiac monitoring,review of multiple databases, neurological assessment, discussion with family, other specialists and medical decision making of high complexity.I have made any additions or clarifications directly to the above note.      To contact Stroke Continuity provider, please refer to [email or web address] After hours, contact General Neurology

## 2014-03-02 NOTE — Evaluation (Signed)
Physical Therapy Evaluation Patient Details Name: Ronald English MRN: [id number] DOB: 01-03-1926 Today's Date: 03/02/2014   History of Present Illness  Pt adm with encephalopathy. MRI - 5 mm mildly intense focus of restricted diffusion within the white matter of the right centrum semi ovale, suspicious for a small acute ischemic infarct. PMH -  CAD, MI  Clinical Impression  Pt admitted with above. Pt currently with functional limitations due to the deficits listed below (see PT Problem List).  Pt will benefit from skilled PT to increase their independence and safety with mobility to allow discharge to the venue listed below. Feel pt will be able to return home with wife and appropriate equipment and HHPT.     Follow Up Recommendations Home health PT;Supervision/Assistance - 24 hour    Equipment Recommendations  Other (comment) (To be determined)    Recommendations for Other Services       Precautions / Restrictions Precautions Precautions: Fall Restrictions Weight Bearing Restrictions: No      Mobility  Bed Mobility               General bed mobility comments: Pt sitting EOB on arrival.  Transfers Overall transfer level: Needs assistance Equipment used: None Transfers: Sit to/from Stand Sit to Stand: Min guard         General transfer comment: No physical assist needed. Min guard for safety.  Ambulation/Gait Ambulation/Gait assistance: Min assist;Min guard Ambulation Distance (Feet): 250 Feet Assistive device: 1 person hand held assist;None;Rolling walker (2 wheeled) Gait Pattern/deviations: Step-through pattern;Decreased step length - right;Decreased step length - left;Trunk flexed;Narrow base of support Gait velocity: decr Gait velocity interpretation: Below normal speed for age/gender General Gait Details: Pt with slight incr rt lateral lean with gait initially requiring min A. Pt with incr steadiness using walker. With incr distance stability improved and  able to amb in room without assistive device and min guard assist.  Stairs            Wheelchair Mobility    Modified Rankin (Stroke Patients Only) Modified Rankin (Stroke Patients Only) Pre-Morbid Rankin Score: No symptoms Modified Rankin: Moderately severe disability     Balance Overall balance assessment: Needs assistance Sitting-balance support: No upper extremity supported;Feet supported Sitting balance-Leahy Scale: Good     Standing balance support: No upper extremity supported Standing balance-Leahy Scale: Fair                               Pertinent Vitals/Pain Pain Assessment: No/denies pain    Home Living Family/patient expects to be discharged to:: Private residence Living Arrangements: Spouse/significant other Available Help at Discharge: Family;Available 24 hours/day Type of Home: House Home Access: Stairs to enter   Entrance Stairs-Number of Steps: 1 Home Layout: One level   Additional Comments: May have access to a walker.    Prior Function Level of Independence: Independent               Hand Dominance        Extremity/Trunk Assessment   Upper Extremity Assessment: Defer to OT evaluation           Lower Extremity Assessment: Generalized weakness         Communication   Communication: HOH  Cognition Arousal/Alertness: Awake/alert Behavior During Therapy: WFL for tasks assessed/performed                        General Comments  Exercises        Assessment/Plan    PT Assessment Patient needs continued PT services  PT Diagnosis Difficulty walking;Generalized weakness   PT Problem List Decreased strength;Decreased balance;Decreased mobility;Decreased knowledge of use of DME  PT Treatment Interventions DME instruction;Gait training;Functional mobility training;Therapeutic activities;Therapeutic exercise;Balance training;Patient/family education   PT Goals (Current goals can be found in the  Care Plan section) Acute Rehab PT Goals Patient Stated Goal: Return home PT Goal Formulation: With patient Time For Goal Achievement: 03/09/14 Potential to Achieve Goals: Good    Frequency Min 4X/week   Barriers to discharge Decreased caregiver support Pt's wife frail and unable to provide physical assistance.    Co-evaluation               End of Session Equipment Utilized During Treatment: Gait belt Activity Tolerance: Patient tolerated treatment well Patient left: in chair;with call bell/phone within reach;with chair alarm set;with family/visitor present Nurse Communication: Mobility status         Time: 1216-2446 PT Time Calculation (min): 25 min   Charges:   PT Evaluation $Initial PT Evaluation Tier I: 1 Procedure PT Treatments $Gait Training: 8-22 mins   PT G Codes:          Fredrico Beedle 16-Mar-2014, 11:35 AM  [facility] PT [phone]

## 2014-03-02 NOTE — Progress Notes (Signed)
PROGRESS NOTE  Ronald English YTK:[id number] DOB: [birth year] DOA: 02/28/2014 PCP: Criselda Peaches, MD  Assessment/Plan: Encephalopathy prob due to CVA -clinically dry on exam  -hydrate pt  -ammonia level ok -MRI brain- + small lesion- consult neuro -panculture  -TSH mildly elevated- on armour thyroid - free t4 ok- follow as outpatient  CVA -patient does not want an ASA as he says it makes his vision worse -echo:  - Left ventricle: The cavity size was normal. Systolic function was normal. The estimated ejection fraction was in the range of 55% to 60%. Wall motion was normal; there were no regional wall motion abnormalities. Left ventricular diastolic function parameters were normal. - Aortic valve: Valve area (VTI): 1.27 cm^2. Valve area (Vmax): 1.35 cm^2. - Mitral valve: Moderately calcified annulus. Carotid: Right = 40-59% ICA stenosis, based on velocities. However, systolic velocity doubles from previous segment, suggesting a more significant stenosis. Left = 40-59% ICA stenosis   CAD/HTN  -no active CP currently  -CEs negative -continue home regimen  - pro BNP- mildly elevated   Hyponatremia  -likely secondary to dehydration  -improved, d/c IVF  Thrombocytopenia  -Unclear etiology  -improved   Code Status: full Family Communication: wife/daughter at bedside Disposition Plan:    Consultants:    Procedures:      HPI/Subjective: Doing better today per wife Patient answering questions correctly  Objective: Filed Vitals:   03/02/14 0537  BP: 132/55  Pulse: 79  Temp: 99 F (37.2 C)  Resp: 18    Intake/Output Summary (Last 24 hours) at 03/02/14 0830 Last data filed at 03/02/14 0500  Gross per 24 hour  Intake    120 ml  Output    700 ml  Net   -580 ml   Filed Weights   02/28/14 2243 03/01/14 0346 03/02/14 0542  Weight: 48.4 kg (106 lb 11.2 oz) 48.3 kg (106 lb 7.7 oz) 49.487 kg (109 lb 1.6 oz)    Exam:   General:  A+Ox3,  NAD  Cardiovascular: rrr  Respiratory: clear  Abdomen: +BS, soft  Musculoskeletal: moves all 4 ext   Data Reviewed: Basic Metabolic Panel:  Recent Labs Lab 02/26/14 2152 02/28/14 1319 02/28/14 1849 03/01/14 0030 03/02/14 0054  NA 126* 128*  --  134* 136*  K 4.0 3.9  --  3.7 3.8  CL 89* 91*  --  98 103  CO2 22 24  --  24 22  GLUCOSE 107* 104*  --  93 95  BUN 33* 27*  --  24* 19  CREATININE 1.11 0.99 0.92 0.88 0.81  CALCIUM 8.9 8.7  --  8.2* 8.1*   Liver Function Tests:  Recent Labs Lab 02/26/14 2152 02/28/14 1319 03/01/14 0030 03/02/14 0054  AST 36 26 23 19   ALT 22 18 15 12   ALKPHOS 61 52 44 42  BILITOT 0.9 0.8 0.6 0.6  PROT 7.1 6.4 5.6* 5.1*  ALBUMIN 3.3* 3.1* 2.6* 2.4*   No results found for this basename: LIPASE, AMYLASE,  in the last 168 hours  Recent Labs Lab 02/28/14 1854  AMMONIA 14   CBC:  Recent Labs Lab 02/26/14 2152 02/28/14 1319 02/28/14 1849 03/01/14 0030 03/02/14 0054  WBC 6.6 5.3 4.6 4.5 5.7  NEUTROABS 4.4  --   --  2.0 2.7  HGB 14.2 13.1 11.3* 11.8* 11.8*  HCT 40.0 37.8* 32.8* 34.1* 34.3*  MCV 87.0 87.1 85.6 85.5 87.1  PLT 45* 66* 66* 73* 105*   Cardiac Enzymes:  Recent Labs Lab 02/28/14  1822 02/28/14 1853 03/01/14 0030 03/01/14 0644  TROPONINI <0.30 <0.30 <0.30 <0.30   BNP (last 3 results)  Recent Labs  02/28/14 1853  PROBNP 772.4*   CBG:  Recent Labs Lab 02/26/14 2141 02/28/14 1331  GLUCAP 111* 87    Recent Results (from the past 240 hour(s))  URINE CULTURE     Status: None   Collection Time    02/28/14 10:43 PM      Result Value Ref Range Status   Specimen Description URINE, RANDOM   Final   Special Requests NONE   Final   Culture  Setup Time     Final   Value: 03/01/2014 00:02     Performed at [place]     Final   Value: NO GROWTH     Performed at [facility]   Culture     Final   Value: NO GROWTH     Performed at [facility]   Report Status  03/02/2014 FINAL   Final     Studies: Dg Orbits  02/28/2014   CLINICAL DATA:  Pre MRI screening.  EXAM: ORBITS - COMPLETE 4+ VIEW  COMPARISON:  None.  FINDINGS: There is no evidence of fracture or other significant bone abnormality. No orbital emphysema or sinus air-fluid levels are seen. No radiopaque foreign body.  IMPRESSION: Negative.   Electronically Signed   By: Dereck Ligas M.D.   On: 02/28/2014 20:37   Dg Chest 2 View  02/28/2014   CLINICAL DATA:  Confusion.  Dizziness.  EXAM: CHEST  2 VIEW  COMPARISON:  02/26/2014.  FINDINGS: Lower lung volumes are present. The cardiopericardial silhouette appears within normal limits. Aortic arch atherosclerosis. Chondrocalcinosis is noted in both humeral heads. There is no airspace disease. Chronic thoracic compression fractures appear unchanged compared to prior. No focal consolidation. No pleural effusion. Cardiac stent noted on the lateral view.  IMPRESSION: No acute cardiopulmonary disease. Lower lung volumes than on prior with underlying emphysema.   Electronically Signed   By: Dereck Ligas M.D.   On: 02/28/2014 20:37   Mr Brain Wo Contrast  02/28/2014   CLINICAL DATA:  Encephalopathy.  EXAM: MRI HEAD WITHOUT CONTRAST  TECHNIQUE: Multiplanar, multiecho pulse sequences of the brain and surrounding structures were obtained without intravenous contrast.  COMPARISON:  Prior CT from 02/27/2014  FINDINGS: Small 5 mm focus of restricted diffusion seen within the deep white matter of the right centrum semi ovale (series 4, image 24) most consistent with a probable small acute ischemic infarct. No associated hemorrhage or significant mass effect. No other acute intracranial infarct. Normal flow voids seen within the intracranial vasculature. Cavernous segments of the internal carotid arteries are mildly ectatic without aneurysm.  Diffuse prominence of the CSF containing spaces is compatible with generalized cerebral atrophy. Scattered and confluent T2/FLAIR  hyperintensity within the periventricular and deep white matter both cerebral hemispheres is most consistent with chronic small vessel ischemic changes.  No mass or midline shift. Ventricular prominence related to global cerebral parenchymal volume loss present. No hydrocephalus. No extra-axial fluid collection.  No acute abnormality seen about either orbit.  Paranasal sinuses and mastoid air cells are clear.  Calvarium demonstrates a normal appearance with normal signal intensity. Scalp soft tissues are unremarkable.  IMPRESSION: 1. 5 mm mildly intense focus of restricted diffusion within the white matter of the right centrum semi ovale, suspicious for a small acute ischemic infarct. No associated hemorrhage or significant mass effect. 2. No  other acute intracranial abnormality identified. 3. Advanced cerebral atrophy with chronic small vessel ischemic disease.   Electronically Signed   By: Jeannine Boga M.D.   On: 02/28/2014 23:09    Scheduled Meds: . aspirin  325 mg Oral Daily  . sodium chloride  3 mL Intravenous Q12H  . thyroid  30 mg Oral Daily   Continuous Infusions:   Antibiotics Given (last 72 hours)   None      Principal Problem:   CVA (cerebral infarction) Active Problems:   Hypercholesterolemia   Carotid stenosis   Encephalopathy    Time spent: 35 min    Ronald English  [facility] Pager [phone]. If 7PM-7AM, please contact night-coverage at [email or web address], password [facility] 03/02/2014, 8:30 AM  LOS: 2 days

## 2014-03-03 LAB — CBC WITH DIFFERENTIAL/PLATELET
Basophils Absolute: 0.1 10*3/uL (ref 0.0–0.1)
Basophils Relative: 1 % (ref 0–1)
EOS PCT: 1 % (ref 0–5)
Eosinophils Absolute: 0.1 10*3/uL (ref 0.0–0.7)
HCT: 33.2 % — ABNORMAL LOW (ref 39.0–52.0)
Hemoglobin: 11.5 g/dL — ABNORMAL LOW (ref 13.0–17.0)
LYMPHS PCT: 47 % — AB (ref 12–46)
Lymphs Abs: 3.1 10*3/uL (ref 0.7–4.0)
MCH: 30.3 pg (ref 26.0–34.0)
MCHC: 34.6 g/dL (ref 30.0–36.0)
MCV: 87.4 fL (ref 78.0–100.0)
MONOS PCT: 9 % (ref 3–12)
Monocytes Absolute: 0.6 10*3/uL (ref 0.1–1.0)
NEUTROS ABS: 2.8 10*3/uL (ref 1.7–7.7)
Neutrophils Relative %: 42 % — ABNORMAL LOW (ref 43–77)
Platelets: 127 10*3/uL — ABNORMAL LOW (ref 150–400)
RBC: 3.8 MIL/uL — ABNORMAL LOW (ref 4.22–5.81)
RDW: 13.2 % (ref 11.5–15.5)
WBC: 6.7 10*3/uL (ref 4.0–10.5)

## 2014-03-03 LAB — COMPREHENSIVE METABOLIC PANEL
ALT: 12 U/L (ref 0–53)
ANION GAP: 9 (ref 5–15)
AST: 21 U/L (ref 0–37)
Albumin: 2.4 g/dL — ABNORMAL LOW (ref 3.5–5.2)
Alkaline Phosphatase: 43 U/L (ref 39–117)
BUN: 15 mg/dL (ref 6–23)
CO2: 25 mEq/L (ref 19–32)
Calcium: 8.7 mg/dL (ref 8.4–10.5)
Chloride: 103 mEq/L (ref 96–112)
Creatinine, Ser: 0.85 mg/dL (ref 0.50–1.35)
GFR calc Af Amer: 90 mL/min — ABNORMAL LOW (ref >90)
GFR calc non Af Amer: 77 mL/min — ABNORMAL LOW (ref >90)
Glucose, Bld: 99 mg/dL (ref 70–99)
POTASSIUM: 3.9 meq/L (ref 3.7–5.3)
Sodium: 137 mEq/L (ref 137–147)
TOTAL PROTEIN: 5.3 g/dL — AB (ref 6.0–8.3)
Total Bilirubin: 0.5 mg/dL (ref 0.3–1.2)

## 2014-03-03 MED ORDER — ATORVASTATIN CALCIUM 10 MG PO TABS
10.0000 mg | ORAL_TABLET | Freq: Every day | ORAL | Status: DC
  Filled 2014-03-03: qty 1

## 2014-03-03 MED ORDER — ATORVASTATIN CALCIUM 10 MG PO TABS: 10.0000 mg | ORAL_TABLET | Freq: Every day | ORAL | Status: DC

## 2014-03-03 MED ORDER — CLOPIDOGREL BISULFATE 75 MG PO TABS: 75.0000 mg | ORAL_TABLET | Freq: Every day | ORAL | Status: DC

## 2014-03-03 NOTE — Progress Notes (Signed)
Pt discharged per MD order and protocol. Discharge instructions reviewed with patient and all questions answered. Pt given all prescriptions and aware of follow up appointments.  

## 2014-03-03 NOTE — Discharge Summary (Signed)
Physician Discharge Summary  Ronald English HQI:[id number] DOB: 31-Oct-1925 DOA: 02/28/2014  PCP: Criselda Peaches, MD  Admit date: 02/28/2014 Discharge date: 03/03/2014  Time spent: 35 minutes  Recommendations for Outpatient Follow-up:  1. Home health PT 2. 24 hour supervision- spoke with son 3. Monitor TSH as outpatient  Discharge Diagnoses:  Principal Problem:   CVA (cerebral infarction) Active Problems:   Hypercholesterolemia   Carotid stenosis   Encephalopathy   Discharge Condition: improved  Diet recommendation: cardiac  Filed Weights   03/01/14 0346 03/02/14 0542 03/03/14 0500  Weight: 48.3 kg (106 lb 7.7 oz) 49.487 kg (109 lb 1.6 oz) 49.2 kg (108 lb 7.5 oz)    History of present illness:  This is a 80 y.o. year old male with significant past medical history of CAD s/p MI, HTN, hypothyroidism presenting with encephalopathy. Per the family, pt has had decreased appetite and fatigue over past 2-3 weeks. Presented to ER 2-3 days ago 2/ sxs (during epic downtime). Workup was essentially negative apart from ? UTI and hyponatremia/dehydration. Unclear if pt was rxd medication for this. Wife states he was given IVF. Had follow up with PCP about sxs earlier today. PCP was concerned about possible stroke and was directed back to ER for further evaluation.  On presentation, hemodynamically stable. Afebrile. BP 120s-130s/60s. Satting 99% on RA. WBC 6.6, Hgb 14.2, Plt 45, Na 126, Cr 1.11, BUN 33. 8/11, 8/12 Head CT and CXR with no acute findings.    Hospital Course:  CVA: MRI -5 mm mildly intense focus of restricted diffusion within the white matter of the right centrum semi ovale, suspicious for a small acute ischemic infarct. Infarct felt secondary to small vessel disease. On no antithrombotics prior to admission. Now on Plavix 75 mg daily. for secondary stroke prevention. Patient with resultant resolution of deficits except for very mild left upper extremity weakness LDL 90 but HDL  only 18- start statin Patient refused ASA- start plavix  AMS -resolved Work up showed CVA  Hyponatremia  -likely secondary to dehydration  -improved, d/c IVF   Thrombocytopenia  -Unclear etiology  -improved  Procedures:  Echo  Carotid  CTA  Consultations:  neurology  Discharge Exam: Filed Vitals:   03/03/14 0504  BP: 124/64  Pulse: 71  Temp: 98 F (36.7 C)  Resp: 18    General: A+Ox3, NAD Cardiovascular: rrr Respiratory: clear  Discharge Instructions You were cared for by a hospitalist during your hospital stay. If you have any questions about your discharge medications or the care you received while you were in the hospital after you are discharged, you can call the unit and asked to speak with the hospitalist on call if the hospitalist that took care of you is not available. Once you are discharged, your primary care physician will handle any further medical issues. Please note that NO REFILLS for any discharge medications will be authorized once you are discharged, as it is imperative that you return to your primary care physician (or establish a relationship with a primary care physician if you do not have one) for your aftercare needs so that they can reassess your need for medications and monitor your lab values.      Discharge Instructions   Diet - low sodium heart healthy    Complete by:  As directed      Discharge instructions    Complete by:  As directed   Home health PT 24 hour supervision No driving     Increase activity  slowly    Complete by:  As directed             Medication List    STOP taking these medications       RIBOFLAVIN PO      TAKE these medications       atorvastatin 10 MG tablet  Commonly known as:  LIPITOR  Take 1 tablet (10 mg total) by mouth daily at 6 PM.     B-12 PO  Take 1 tablet by mouth daily.     clopidogrel 75 MG tablet  Commonly known as:  PLAVIX  Take 1 tablet (75 mg total) by mouth daily.     CO  Q-10 PO  Take 1 tablet by mouth daily.     KELP PO  Take 1 tablet by mouth daily.     nitroGLYCERIN 0.4 MG SL tablet  Commonly known as:  NITROSTAT  Place 1 tablet (0.4 mg total) under the tongue every 5 (five) minutes as needed for chest pain.     thiamine 100 MG tablet  Commonly known as:  VITAMIN B-1  Take 100 mg by mouth daily.     thyroid 30 MG tablet  Commonly known as:  ARMOUR  Take 30 mg by mouth daily.       Allergies  Allergen Reactions  . Codeine Itching   Follow-up Information   Follow up with Xu,Jindong, MD. Schedule an appointment as soon as possible for a visit in 1 month.   Specialty:  Neurology   Contact information:   [address] [place] [address] [phone]        The results of significant diagnostics from this hospitalization (including imaging, microbiology, ancillary and laboratory) are listed below for reference.    Significant Diagnostic Studies: Ct Angio Head W/cm &/or Wo Cm  03/02/2014   CLINICAL DATA:  Altered mental status. Possible small, acute infarct in the right centrum semiovale on MRI.  EXAM: CT ANGIOGRAPHY HEAD AND NECK  TECHNIQUE: Multidetector CT imaging of the head and neck was performed using the standard protocol during bolus administration of intravenous contrast. Multiplanar CT image reconstructions and MIPs were obtained to evaluate the vascular anatomy. Carotid stenosis measurements (when applicable) are obtained utilizing NASCET criteria, using the distal internal carotid diameter as the denominator.  CONTRAST:  62mL OMNIPAQUE IOHEXOL 350 MG/ML SOLN  COMPARISON:  Brain MRI 02/28/2014.  Head CT 02/27/2014.  FINDINGS: CTA HEAD FINDINGS  Noncontrast head CT is without evidence of acute large territory cortical infarct, mass, midline shift, intracranial hemorrhage, or extra-axial fluid collection. Moderate cerebral atrophy is unchanged. Periventricular white matter hypodensities are unchanged and nonspecific but  compatible with moderate chronic small vessel ischemic disease. Punctate, curvilinear focus of enhancement in the right basal ganglia is likely vascular. Prior bilateral cataract extraction is noted. Mastoid air cells and paranasal sinuses are clear.  Intracranial vertebral arteries are patent to the basilar. PICA origins are patent. SCA origins are patent. Basilar artery is patent without stenosis. PCAs are unremarkable. Posterior communicating arteries are not clearly identified. Internal carotid arteries are patent from skullbase to carotid termini. There is moderate, diffuse carotid siphon atherosclerotic calcification bilaterally without evidence of flow limiting stenosis. There is mild supraclinoid ICA narrowing bilaterally. ACAs and MCAs are unremarkable. No intracranial aneurysm is identified.  Review of the MIP images confirms the above findings.  CTA NECK FINDINGS  Three vessel aortic arch with moderate to extensive aortic arch atherosclerotic calcification. Calcified and noncalcified plaque in the  brachiocephalic and bilateral proximal subclavian arteries resulting in less than 50% stenosis. Right common carotid artery is patent without stenosis. Moderate, predominantly calcified plaque in the proximal right internal carotid artery extends for approximately the first 3 cm of the ICA and results in up to 55% stenosis. Eccentric calcification in the proximal to mid left common carotid artery does not result in stenosis. Mild to moderate calcified plaque in the proximal left ICA results in approximately 45% stenosis. Mild luminal irregularity of the distal cervical ICAs is due to predominantly noncalcified plaque, with a small focus of likely plaque ulceration along the distal left ICA posterior margin. Vertebral arteries are patent with the left being dominant. Prominent calcification is noted adjacent to both vertebral artery origins, with likely mild narrowing of the right vertebral artery at its origin.   Centrilobular emphysema is noted as well as biapical scarring. Multiple small mediastinal lymph nodes are partially visualized. Extensive, multilevel cervical facet arthrosis is present. Advanced multilevel cervical disc degeneration is also seen.  Review of the MIP images confirms the above findings.  IMPRESSION: 1. No major intracranial arterial occlusion. 2. Moderate carotid siphon atherosclerosis. No evidence of high-grade proximal intracranial stenosis. 3. 55% stenosis of the proximal right ICA and 45% stenosis of the proximal left ICA.   Electronically Signed   By: Logan Bores   On: 03/02/2014 16:57   Dg Orbits  02/28/2014   CLINICAL DATA:  Pre MRI screening.  EXAM: ORBITS - COMPLETE 4+ VIEW  COMPARISON:  None.  FINDINGS: There is no evidence of fracture or other significant bone abnormality. No orbital emphysema or sinus air-fluid levels are seen. No radiopaque foreign body.  IMPRESSION: Negative.   Electronically Signed   By: Dereck Ligas M.D.   On: 02/28/2014 20:37   Dg Chest 2 View  02/28/2014   CLINICAL DATA:  Confusion.  Dizziness.  EXAM: CHEST  2 VIEW  COMPARISON:  02/26/2014.  FINDINGS: Lower lung volumes are present. The cardiopericardial silhouette appears within normal limits. Aortic arch atherosclerosis. Chondrocalcinosis is noted in both humeral heads. There is no airspace disease. Chronic thoracic compression fractures appear unchanged compared to prior. No focal consolidation. No pleural effusion. Cardiac stent noted on the lateral view.  IMPRESSION: No acute cardiopulmonary disease. Lower lung volumes than on prior with underlying emphysema.   Electronically Signed   By: Dereck Ligas M.D.   On: 02/28/2014 20:37   Dg Chest 2 View  02/26/2014   CLINICAL DATA:  Cough, congestion and shortness of breath.  EXAM: CHEST  2 VIEW  COMPARISON:  None.  FINDINGS: The lungs are hyperexpanded, with flattening of the hemidiaphragms, compatible with COPD. A 1.0 cm calcified granuloma is noted  at the left midlung zone. Minimal bilateral scarring is noted. The costophrenic angles are incompletely imaged on this study, on the frontal view. There is no evidence of pleural effusion or pneumothorax.  The heart is normal in size; the mediastinal contour is within normal limits. No acute osseous abnormalities are seen. There is mild chronic loss of height at the lower thoracic and upper lumbar spine.  IMPRESSION: Findings of COPD.  No acute cardiopulmonary process seen.   Electronically Signed   By: Garald Balding M.D.   On: 02/26/2014 23:10   Ct Head Wo Contrast  02/27/2014   CLINICAL DATA:  Fatigue.  Dehydration.  EXAM: CT HEAD WITHOUT CONTRAST  TECHNIQUE: Contiguous axial images were obtained from the base of the skull through the vertex without intravenous contrast.  COMPARISON:  No priors.  FINDINGS: Mild cerebral atrophy. Extensive patchy and confluent areas of low attenuation throughout the deep and periventricular white matter of the cerebral hemispheres bilaterally, compatible with chronic microvascular ischemic disease. No definite acute intracranial abnormality. Specifically, no definite signs of acute/subacute cerebral ischemia, no evidence of acute intracranial hemorrhage, no mass, mass effect, hydrocephalus or abnormal intra or extra-axial fluid collections. Visualized paranasal sinuses and mastoids are well pneumatized. No acute displaced skull fractures are noted.  IMPRESSION: 1. No acute intracranial abnormalities. 2. Mild cerebral atrophy with extensive chronic microvascular ischemic changes throughout the deep and periventricular white matter of the cerebral hemispheres bilaterally.   Electronically Signed   By: Vinnie Langton M.D.   On: 02/27/2014 06:29   Ct Angio Neck W/cm &/or Wo/cm  03/02/2014   CLINICAL DATA:  Altered mental status. Possible small, acute infarct in the right centrum semiovale on MRI.  EXAM: CT ANGIOGRAPHY HEAD AND NECK  TECHNIQUE: Multidetector CT imaging of the  head and neck was performed using the standard protocol during bolus administration of intravenous contrast. Multiplanar CT image reconstructions and MIPs were obtained to evaluate the vascular anatomy. Carotid stenosis measurements (when applicable) are obtained utilizing NASCET criteria, using the distal internal carotid diameter as the denominator.  CONTRAST:  7mL OMNIPAQUE IOHEXOL 350 MG/ML SOLN  COMPARISON:  Brain MRI 02/28/2014.  Head CT 02/27/2014.  FINDINGS: CTA HEAD FINDINGS  Noncontrast head CT is without evidence of acute large territory cortical infarct, mass, midline shift, intracranial hemorrhage, or extra-axial fluid collection. Moderate cerebral atrophy is unchanged. Periventricular white matter hypodensities are unchanged and nonspecific but compatible with moderate chronic small vessel ischemic disease. Punctate, curvilinear focus of enhancement in the right basal ganglia is likely vascular. Prior bilateral cataract extraction is noted. Mastoid air cells and paranasal sinuses are clear.  Intracranial vertebral arteries are patent to the basilar. PICA origins are patent. SCA origins are patent. Basilar artery is patent without stenosis. PCAs are unremarkable. Posterior communicating arteries are not clearly identified. Internal carotid arteries are patent from skullbase to carotid termini. There is moderate, diffuse carotid siphon atherosclerotic calcification bilaterally without evidence of flow limiting stenosis. There is mild supraclinoid ICA narrowing bilaterally. ACAs and MCAs are unremarkable. No intracranial aneurysm is identified.  Review of the MIP images confirms the above findings.  CTA NECK FINDINGS  Three vessel aortic arch with moderate to extensive aortic arch atherosclerotic calcification. Calcified and noncalcified plaque in the brachiocephalic and bilateral proximal subclavian arteries resulting in less than 50% stenosis. Right common carotid artery is patent without stenosis.  Moderate, predominantly calcified plaque in the proximal right internal carotid artery extends for approximately the first 3 cm of the ICA and results in up to 55% stenosis. Eccentric calcification in the proximal to mid left common carotid artery does not result in stenosis. Mild to moderate calcified plaque in the proximal left ICA results in approximately 45% stenosis. Mild luminal irregularity of the distal cervical ICAs is due to predominantly noncalcified plaque, with a small focus of likely plaque ulceration along the distal left ICA posterior margin. Vertebral arteries are patent with the left being dominant. Prominent calcification is noted adjacent to both vertebral artery origins, with likely mild narrowing of the right vertebral artery at its origin.  Centrilobular emphysema is noted as well as biapical scarring. Multiple small mediastinal lymph nodes are partially visualized. Extensive, multilevel cervical facet arthrosis is present. Advanced multilevel cervical disc degeneration is also seen.  Review of the MIP images confirms the above  findings.  IMPRESSION: 1. No major intracranial arterial occlusion. 2. Moderate carotid siphon atherosclerosis. No evidence of high-grade proximal intracranial stenosis. 3. 55% stenosis of the proximal right ICA and 45% stenosis of the proximal left ICA.   Electronically Signed   By: Logan Bores   On: 03/02/2014 16:57   Mr Brain Wo Contrast  02/28/2014   CLINICAL DATA:  Encephalopathy.  EXAM: MRI HEAD WITHOUT CONTRAST  TECHNIQUE: Multiplanar, multiecho pulse sequences of the brain and surrounding structures were obtained without intravenous contrast.  COMPARISON:  Prior CT from 02/27/2014  FINDINGS: Small 5 mm focus of restricted diffusion seen within the deep white matter of the right centrum semi ovale (series 4, image 24) most consistent with a probable small acute ischemic infarct. No associated hemorrhage or significant mass effect. No other acute intracranial  infarct. Normal flow voids seen within the intracranial vasculature. Cavernous segments of the internal carotid arteries are mildly ectatic without aneurysm.  Diffuse prominence of the CSF containing spaces is compatible with generalized cerebral atrophy. Scattered and confluent T2/FLAIR hyperintensity within the periventricular and deep white matter both cerebral hemispheres is most consistent with chronic small vessel ischemic changes.  No mass or midline shift. Ventricular prominence related to global cerebral parenchymal volume loss present. No hydrocephalus. No extra-axial fluid collection.  No acute abnormality seen about either orbit.  Paranasal sinuses and mastoid air cells are clear.  Calvarium demonstrates a normal appearance with normal signal intensity. Scalp soft tissues are unremarkable.  IMPRESSION: 1. 5 mm mildly intense focus of restricted diffusion within the white matter of the right centrum semi ovale, suspicious for a small acute ischemic infarct. No associated hemorrhage or significant mass effect. 2. No other acute intracranial abnormality identified. 3. Advanced cerebral atrophy with chronic small vessel ischemic disease.   Electronically Signed   By: Jeannine Boga M.D.   On: 02/28/2014 23:09    Microbiology: Recent Results (from the past 240 hour(s))  CULTURE, BLOOD (ROUTINE X 2)     Status: None   Collection Time    02/28/14  7:10 PM      Result Value Ref Range Status   Specimen Description BLOOD ARM RIGHT   Final   Special Requests BOTTLES DRAWN AEROBIC ONLY 5CC   Final   Culture  Setup Time     Final   Value: 03/01/2014 01:19     Performed at [facility]   Culture     Final   Value:        BLOOD CULTURE RECEIVED NO GROWTH TO DATE CULTURE WILL BE HELD FOR 5 DAYS BEFORE ISSUING A FINAL NEGATIVE REPORT     Performed at [facility]   Report Status PENDING   Incomplete  CULTURE, BLOOD (ROUTINE X 2)     Status: None   Collection Time    02/28/14   7:33 PM      Result Value Ref Range Status   Specimen Description BLOOD RIGHT FOREARM   Final   Special Requests BOTTLES DRAWN AEROBIC ONLY 2CC   Final   Culture  Setup Time     Final   Value: 03/01/2014 01:20     Performed at [facility]   Culture     Final   Value:        BLOOD CULTURE RECEIVED NO GROWTH TO DATE CULTURE WILL BE HELD FOR 5 DAYS BEFORE ISSUING A FINAL NEGATIVE REPORT     Performed at [facility]   Report  Status PENDING   Incomplete  URINE CULTURE     Status: None   Collection Time    02/28/14 10:43 PM      Result Value Ref Range Status   Specimen Description URINE, RANDOM   Final   Special Requests NONE   Final   Culture  Setup Time     Final   Value: 03/01/2014 00:02     Performed at [facility]     Final   Value: NO GROWTH     Performed at [facility]   Culture     Final   Value: NO GROWTH     Performed at [facility]   Report Status 03/02/2014 FINAL   Final     Labs: Basic Metabolic Panel:  Recent Labs Lab 02/26/14 2152 02/28/14 1319 02/28/14 1849 03/01/14 0030 03/02/14 0054 03/03/14 0530  NA 126* 128*  --  134* 136* 137  K 4.0 3.9  --  3.7 3.8 3.9  CL 89* 91*  --  98 103 103  CO2 22 24  --  24 22 25   GLUCOSE 107* 104*  --  93 95 99  BUN 33* 27*  --  24* 19 15  CREATININE 1.11 0.99 0.92 0.88 0.81 0.85  CALCIUM 8.9 8.7  --  8.2* 8.1* 8.7   Liver Function Tests:  Recent Labs Lab 02/26/14 2152 02/28/14 1319 03/01/14 0030 03/02/14 0054 03/03/14 0530  AST 36 26 23 19 21   ALT 22 18 15 12 12   ALKPHOS 61 52 44 42 43  BILITOT 0.9 0.8 0.6 0.6 0.5  PROT 7.1 6.4 5.6* 5.1* 5.3*  ALBUMIN 3.3* 3.1* 2.6* 2.4* 2.4*   No results found for this basename: LIPASE, AMYLASE,  in the last 168 hours  Recent Labs Lab 02/28/14 1854  AMMONIA 14   CBC:  Recent Labs Lab 02/26/14 2152 02/28/14 1319 02/28/14 1849 03/01/14 0030 03/02/14 0054 03/03/14 0530  WBC 6.6 5.3 4.6 4.5 5.7 6.7   NEUTROABS 4.4  --   --  2.0 2.7 2.8  HGB 14.2 13.1 11.3* 11.8* 11.8* 11.5*  HCT 40.0 37.8* 32.8* 34.1* 34.3* 33.2*  MCV 87.0 87.1 85.6 85.5 87.1 87.4  PLT 45* 66* 66* 73* 105* 127*   Cardiac Enzymes:  Recent Labs Lab 02/28/14 1822 02/28/14 1853 03/01/14 0030 03/01/14 0644  TROPONINI <0.30 <0.30 <0.30 <0.30   BNP: BNP (last 3 results)  Recent Labs  02/28/14 1853  PROBNP 772.4*   CBG:  Recent Labs Lab 02/26/14 2141 02/28/14 1331  GLUCAP 111* 87       Signed:  VANN, JESSICA  [facility] 03/03/2014, 8:00 AM

## 2014-03-03 NOTE — Progress Notes (Signed)
Physical Therapy Treatment Patient Details Name: Ronald English MRN: [id number] DOB: Aug 24, 1925 Today's Date: 03/03/2014    History of Present Illness Pt adm with encephalopathy. MRI - 5 mm mildly intense focus of restricted diffusion within the white matter of the right centrum semi ovale, suspicious for a small acute ischemic infarct. PMH -  CAD, MI    PT Comments    Pt with improved ambulation without RW this date however con't to remain at increased falls risk due higher level balance deficits. Pt educated not to ambulate outside without family or HHPT. Family and pt agrees.   Follow Up Recommendations  Home health PT;Supervision/Assistance - 24 hour     Equipment Recommendations    none   Recommendations for Other Services       Precautions / Restrictions Precautions Precautions: Fall Restrictions Weight Bearing Restrictions: No    Mobility  Bed Mobility               General bed mobility comments: pt in recliner  Transfers Overall transfer level: Needs assistance Equipment used: None Transfers: Sit to/from Stand Sit to Stand: Supervision            Ambulation/Gait Ambulation/Gait assistance: Min guard Ambulation Distance (Feet): 300 Feet Assistive device: None Gait Pattern/deviations: Step-through pattern;Decreased stride length     General Gait Details: no obvious episodes of LOB but mildly unsteady   Stairs            Wheelchair Mobility    Modified Rankin (Stroke Patients Only) Modified Rankin (Stroke Patients Only) Pre-Morbid Rankin Score: No symptoms Modified Rankin: Moderate disability     Balance Overall balance assessment: Needs assistance           Standing balance-Leahy Scale: Fair                 High Level Balance Comments: provided min/mod pertebations ant/post and laterally. Pt able to correct and maintain balance.    Cognition Arousal/Alertness: Awake/alert Behavior During Therapy: WFL for tasks  assessed/performed Overall Cognitive Status: Within Functional Limits for tasks assessed                      Exercises      General Comments        Pertinent Vitals/Pain Pain Assessment: No/denies pain    Home Living Family/patient expects to be discharged to:: Private residence Living Arrangements: Spouse/significant other                  Prior Function            PT Goals (current goals can now be found in the care plan section) Acute Rehab PT Goals Patient Stated Goal: go home Progress towards PT goals: Progressing toward goals    Frequency  Min 3X/week    PT Plan Frequency needs to be updated    Co-evaluation             End of Session Equipment Utilized During Treatment: Gait belt Activity Tolerance: Patient tolerated treatment well Patient left: in chair;with call bell/phone within reach;with chair alarm set;with family/visitor present     Time: 9767-3419 PT Time Calculation (min): 13 min  Charges:  $Gait Training: 8-22 mins                    G Codes:      Kingsley Callander 03/03/2014, 11:03 AM  Kittie Plater, PT, DPT Pager #: [phone] Office #: [phone]

## 2014-03-03 NOTE — Care Management Note (Signed)
    Page 1 of 1   03/03/2014     10:27:26 AM CARE MANAGEMENT NOTE 03/03/2014  Patient:  Ronald English, Ronald English   Account Number:  [id number]  Date Initiated:  03/01/2014  Documentation initiated by:  [facility]  Subjective/Objective Assessment:   AMS     Action/Plan:   waiting PT/OT evaluation   Anticipated DC Date:     Anticipated DC Plan:  [place]  CM consult      [facility] Choice  HOME HEALTH   Choice offered to / List presented to:  C-1 Patient        [place] arranged  [place] PT      [place].   Status of service:  Completed, signed off Medicare Important Message given?  NA - LOS <3 / Initial given by admissions (If response is "NO", the following Medicare IM given date fields will be blank) Date Medicare IM given:   Medicare IM given by:   Date Additional Medicare IM given:   Additional Medicare IM given by:    Discharge Disposition:  [place]  Per UR Regulation:    If discussed at Long Length of Stay Meetings, dates discussed:    Comments:  03/03/14 10:00 CM met with pt who chooses [facility] to render HHPT.  Address and contact information verified with pt. CM called referral to [facility] rep, Stephanie.  No other CM needs were comunicated.  Mariane Masters, BSN, CM [phone].

## 2014-03-04 LAB — PATHOLOGIST SMEAR REVIEW

## 2014-03-07 LAB — CULTURE, BLOOD (ROUTINE X 2)
CULTURE: NO GROWTH
Culture: NO GROWTH

## 2014-04-11 ENCOUNTER — Ambulatory Visit: Payer: Self-pay | Admitting: Neurology
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
## 2014-06-27 ENCOUNTER — Encounter (HOSPITAL_COMMUNITY): Payer: Self-pay | Admitting: Cardiology

## 2014-11-14 DIAGNOSIS — R0989 Other specified symptoms and signs involving the circulatory and respiratory systems: Secondary | ICD-10-CM | POA: Diagnosis not present

## 2014-11-14 DIAGNOSIS — I2510 Atherosclerotic heart disease of native coronary artery without angina pectoris: Secondary | ICD-10-CM | POA: Diagnosis not present

## 2014-11-14 DIAGNOSIS — Z5329 Procedure and treatment not carried out because of patient's decision for other reasons: Secondary | ICD-10-CM | POA: Diagnosis not present

## 2014-11-14 DIAGNOSIS — Z9861 Coronary angioplasty status: Secondary | ICD-10-CM | POA: Diagnosis not present

## 2014-12-11 DIAGNOSIS — Z961 Presence of intraocular lens: Secondary | ICD-10-CM | POA: Diagnosis not present

## 2015-06-20 DIAGNOSIS — G464 Cerebellar stroke syndrome: Secondary | ICD-10-CM | POA: Diagnosis not present

## 2015-06-20 DIAGNOSIS — D559 Anemia due to enzyme disorder, unspecified: Secondary | ICD-10-CM | POA: Diagnosis not present

## 2015-06-20 DIAGNOSIS — I2510 Atherosclerotic heart disease of native coronary artery without angina pectoris: Secondary | ICD-10-CM | POA: Diagnosis not present

## 2015-06-20 DIAGNOSIS — I10 Essential (primary) hypertension: Secondary | ICD-10-CM | POA: Diagnosis not present

## 2015-06-20 DIAGNOSIS — Z0000 Encounter for general adult medical examination without abnormal findings: Secondary | ICD-10-CM | POA: Diagnosis not present

## 2015-06-20 DIAGNOSIS — E039 Hypothyroidism, unspecified: Secondary | ICD-10-CM | POA: Diagnosis not present

## 2015-08-11 DIAGNOSIS — L821 Other seborrheic keratosis: Secondary | ICD-10-CM | POA: Diagnosis not present

## 2015-08-11 DIAGNOSIS — L820 Inflamed seborrheic keratosis: Secondary | ICD-10-CM | POA: Diagnosis not present

## 2015-08-11 DIAGNOSIS — C44311 Basal cell carcinoma of skin of nose: Secondary | ICD-10-CM | POA: Diagnosis not present

## 2015-08-11 DIAGNOSIS — C44629 Squamous cell carcinoma of skin of left upper limb, including shoulder: Secondary | ICD-10-CM | POA: Diagnosis not present

## 2015-09-03 DIAGNOSIS — C44311 Basal cell carcinoma of skin of nose: Secondary | ICD-10-CM | POA: Diagnosis not present

## 2015-09-10 DIAGNOSIS — L570 Actinic keratosis: Secondary | ICD-10-CM | POA: Diagnosis not present

## 2016-04-27 DIAGNOSIS — D559 Anemia due to enzyme disorder, unspecified: Secondary | ICD-10-CM | POA: Diagnosis not present

## 2016-04-27 DIAGNOSIS — E785 Hyperlipidemia, unspecified: Secondary | ICD-10-CM | POA: Diagnosis not present

## 2016-04-27 DIAGNOSIS — Z0000 Encounter for general adult medical examination without abnormal findings: Secondary | ICD-10-CM | POA: Diagnosis not present

## 2016-04-27 DIAGNOSIS — I2510 Atherosclerotic heart disease of native coronary artery without angina pectoris: Secondary | ICD-10-CM | POA: Diagnosis not present

## 2016-04-27 DIAGNOSIS — I10 Essential (primary) hypertension: Secondary | ICD-10-CM | POA: Diagnosis not present

## 2016-04-27 DIAGNOSIS — E039 Hypothyroidism, unspecified: Secondary | ICD-10-CM | POA: Diagnosis not present

## 2016-10-26 DIAGNOSIS — I2510 Atherosclerotic heart disease of native coronary artery without angina pectoris: Secondary | ICD-10-CM | POA: Diagnosis not present

## 2016-10-26 DIAGNOSIS — I639 Cerebral infarction, unspecified: Secondary | ICD-10-CM | POA: Diagnosis not present

## 2016-10-26 DIAGNOSIS — E039 Hypothyroidism, unspecified: Secondary | ICD-10-CM | POA: Diagnosis not present

## 2016-10-26 DIAGNOSIS — I10 Essential (primary) hypertension: Secondary | ICD-10-CM | POA: Diagnosis not present

## 2018-03-06 ENCOUNTER — Observation Stay (HOSPITAL_COMMUNITY): Payer: Medicare Other

## 2018-03-06 ENCOUNTER — Emergency Department (HOSPITAL_COMMUNITY): Payer: Medicare Other

## 2018-03-06 ENCOUNTER — Encounter (HOSPITAL_COMMUNITY): Payer: Self-pay | Admitting: Emergency Medicine

## 2018-03-06 ENCOUNTER — Inpatient Hospital Stay (HOSPITAL_COMMUNITY)
Admission: EM | Admit: 2018-03-06 | Discharge: 2018-03-14 | DRG: 071 | Disposition: A | Discharge status: Skilled Nursing Facility | Payer: Medicare Other | Attending: Student in an Organized Health Care Education/Training Program | Admitting: Student in an Organized Health Care Education/Training Program

## 2018-03-06 ENCOUNTER — Other Ambulatory Visit: Payer: Self-pay

## 2018-03-06 DIAGNOSIS — Z781 Physical restraint status: Secondary | ICD-10-CM

## 2018-03-06 DIAGNOSIS — R4701 Aphasia: Secondary | ICD-10-CM | POA: Diagnosis present

## 2018-03-06 DIAGNOSIS — Z885 Allergy status to narcotic agent status: Secondary | ICD-10-CM

## 2018-03-06 DIAGNOSIS — I6523 Occlusion and stenosis of bilateral carotid arteries: Secondary | ICD-10-CM | POA: Diagnosis present

## 2018-03-06 DIAGNOSIS — R64 Cachexia: Secondary | ICD-10-CM | POA: Diagnosis present

## 2018-03-06 DIAGNOSIS — I951 Orthostatic hypotension: Secondary | ICD-10-CM | POA: Diagnosis present

## 2018-03-06 DIAGNOSIS — Z9181 History of falling: Secondary | ICD-10-CM

## 2018-03-06 DIAGNOSIS — R29710 NIHSS score 10: Secondary | ICD-10-CM | POA: Diagnosis present

## 2018-03-06 DIAGNOSIS — Z85828 Personal history of other malignant neoplasm of skin: Secondary | ICD-10-CM

## 2018-03-06 DIAGNOSIS — E785 Hyperlipidemia, unspecified: Secondary | ICD-10-CM | POA: Diagnosis present

## 2018-03-06 DIAGNOSIS — Z9114 Patient's other noncompliance with medication regimen: Secondary | ICD-10-CM

## 2018-03-06 DIAGNOSIS — Z87891 Personal history of nicotine dependence: Secondary | ICD-10-CM

## 2018-03-06 DIAGNOSIS — E876 Hypokalemia: Secondary | ICD-10-CM | POA: Diagnosis not present

## 2018-03-06 DIAGNOSIS — F0151 Vascular dementia with behavioral disturbance: Secondary | ICD-10-CM | POA: Diagnosis present

## 2018-03-06 DIAGNOSIS — N179 Acute kidney failure, unspecified: Secondary | ICD-10-CM

## 2018-03-06 DIAGNOSIS — Z7189 Other specified counseling: Secondary | ICD-10-CM

## 2018-03-06 DIAGNOSIS — G459 Transient cerebral ischemic attack, unspecified: Secondary | ICD-10-CM

## 2018-03-06 DIAGNOSIS — S3992XA Unspecified injury of lower back, initial encounter: Secondary | ICD-10-CM | POA: Diagnosis not present

## 2018-03-06 DIAGNOSIS — Z7902 Long term (current) use of antithrombotics/antiplatelets: Secondary | ICD-10-CM

## 2018-03-06 DIAGNOSIS — R296 Repeated falls: Secondary | ICD-10-CM | POA: Diagnosis present

## 2018-03-06 DIAGNOSIS — R29810 Facial weakness: Secondary | ICD-10-CM | POA: Diagnosis present

## 2018-03-06 DIAGNOSIS — R059 Cough, unspecified: Secondary | ICD-10-CM

## 2018-03-06 DIAGNOSIS — Z8674 Personal history of sudden cardiac arrest: Secondary | ICD-10-CM

## 2018-03-06 DIAGNOSIS — R4182 Altered mental status, unspecified: Secondary | ICD-10-CM

## 2018-03-06 DIAGNOSIS — J439 Emphysema, unspecified: Secondary | ICD-10-CM | POA: Diagnosis present

## 2018-03-06 DIAGNOSIS — Z515 Encounter for palliative care: Secondary | ICD-10-CM

## 2018-03-06 DIAGNOSIS — G9341 Metabolic encephalopathy: Principal | ICD-10-CM | POA: Diagnosis present

## 2018-03-06 DIAGNOSIS — I252 Old myocardial infarction: Secondary | ICD-10-CM

## 2018-03-06 DIAGNOSIS — I6521 Occlusion and stenosis of right carotid artery: Secondary | ICD-10-CM | POA: Diagnosis not present

## 2018-03-06 DIAGNOSIS — R05 Cough: Secondary | ICD-10-CM

## 2018-03-06 DIAGNOSIS — F05 Delirium due to known physiological condition: Secondary | ICD-10-CM | POA: Diagnosis present

## 2018-03-06 DIAGNOSIS — Z79899 Other long term (current) drug therapy: Secondary | ICD-10-CM

## 2018-03-06 DIAGNOSIS — W19XXXA Unspecified fall, initial encounter: Secondary | ICD-10-CM | POA: Diagnosis not present

## 2018-03-06 DIAGNOSIS — R1310 Dysphagia, unspecified: Secondary | ICD-10-CM

## 2018-03-06 DIAGNOSIS — I700 Atherosclerosis of aorta: Secondary | ICD-10-CM | POA: Diagnosis present

## 2018-03-06 DIAGNOSIS — I2510 Atherosclerotic heart disease of native coronary artery without angina pectoris: Secondary | ICD-10-CM | POA: Diagnosis present

## 2018-03-06 DIAGNOSIS — Z681 Body mass index (BMI) 19 or less, adult: Secondary | ICD-10-CM

## 2018-03-06 DIAGNOSIS — Z66 Do not resuscitate: Secondary | ICD-10-CM | POA: Diagnosis not present

## 2018-03-06 DIAGNOSIS — R001 Bradycardia, unspecified: Secondary | ICD-10-CM | POA: Diagnosis not present

## 2018-03-06 DIAGNOSIS — Z8673 Personal history of transient ischemic attack (TIA), and cerebral infarction without residual deficits: Secondary | ICD-10-CM

## 2018-03-06 DIAGNOSIS — I10 Essential (primary) hypertension: Secondary | ICD-10-CM | POA: Diagnosis present

## 2018-03-06 DIAGNOSIS — E039 Hypothyroidism, unspecified: Secondary | ICD-10-CM | POA: Diagnosis present

## 2018-03-06 DIAGNOSIS — Z955 Presence of coronary angioplasty implant and graft: Secondary | ICD-10-CM

## 2018-03-06 DIAGNOSIS — R410 Disorientation, unspecified: Secondary | ICD-10-CM

## 2018-03-06 DIAGNOSIS — Y92230 Patient room in hospital as the place of occurrence of the external cause: Secondary | ICD-10-CM | POA: Diagnosis not present

## 2018-03-06 LAB — DIFFERENTIAL
Abs Immature Granulocytes: 0 10*3/uL (ref 0.0–0.1)
BASOS ABS: 0.1 10*3/uL (ref 0.0–0.1)
BASOS PCT: 1 %
EOS ABS: 2.3 10*3/uL — AB (ref 0.0–0.7)
Eosinophils Relative: 20 %
Immature Granulocytes: 0 %
Lymphocytes Relative: 25 %
Lymphs Abs: 2.9 10*3/uL (ref 0.7–4.0)
MONOS PCT: 6 %
Monocytes Absolute: 0.7 10*3/uL (ref 0.1–1.0)
NEUTROS ABS: 5.5 10*3/uL (ref 1.7–7.7)
NEUTROS PCT: 48 %

## 2018-03-06 LAB — COMPREHENSIVE METABOLIC PANEL
ALT: 15 U/L (ref 0–44)
AST: 33 U/L (ref 15–41)
Albumin: 4 g/dL (ref 3.5–5.0)
Alkaline Phosphatase: 50 U/L (ref 38–126)
Anion gap: 16 — ABNORMAL HIGH (ref 5–15)
BUN: 24 mg/dL — ABNORMAL HIGH (ref 8–23)
CHLORIDE: 100 mmol/L (ref 98–111)
CO2: 21 mmol/L — AB (ref 22–32)
Calcium: 9.8 mg/dL (ref 8.9–10.3)
Creatinine, Ser: 1.34 mg/dL — ABNORMAL HIGH (ref 0.61–1.24)
GFR calc Af Amer: 52 mL/min — ABNORMAL LOW (ref >60)
GFR, EST NON AFRICAN AMERICAN: 45 mL/min — AB (ref >60)
Glucose, Bld: 136 mg/dL — ABNORMAL HIGH (ref 70–99)
POTASSIUM: 3.6 mmol/L (ref 3.5–5.1)
SODIUM: 137 mmol/L (ref 135–145)
Total Bilirubin: 1 mg/dL (ref 0.3–1.2)
Total Protein: 6.8 g/dL (ref 6.5–8.1)

## 2018-03-06 LAB — CBC
HCT: 38.1 % — ABNORMAL LOW (ref 39.0–52.0)
HEMOGLOBIN: 12.3 g/dL — AB (ref 13.0–17.0)
MCH: 30.1 pg (ref 26.0–34.0)
MCHC: 32.3 g/dL (ref 30.0–36.0)
MCV: 93.4 fL (ref 78.0–100.0)
PLATELETS: 176 10*3/uL (ref 150–400)
RBC: 4.08 MIL/uL — AB (ref 4.22–5.81)
RDW: 12.7 % (ref 11.5–15.5)
WBC: 11.6 10*3/uL — AB (ref 4.0–10.5)

## 2018-03-06 LAB — I-STAT CHEM 8, ED
BUN: 26 mg/dL — ABNORMAL HIGH (ref 8–23)
CHLORIDE: 100 mmol/L (ref 98–111)
Calcium, Ion: 1.21 mmol/L (ref 1.15–1.40)
Creatinine, Ser: 1.3 mg/dL — ABNORMAL HIGH (ref 0.61–1.24)
Glucose, Bld: 128 mg/dL — ABNORMAL HIGH (ref 70–99)
HEMATOCRIT: 36 % — AB (ref 39.0–52.0)
HEMOGLOBIN: 12.2 g/dL — AB (ref 13.0–17.0)
POTASSIUM: 3.6 mmol/L (ref 3.5–5.1)
Sodium: 137 mmol/L (ref 135–145)
TCO2: 21 mmol/L — ABNORMAL LOW (ref 22–32)

## 2018-03-06 LAB — LIPID PANEL
Cholesterol: 169 mg/dL (ref 0–200)
HDL: 35 mg/dL — ABNORMAL LOW (ref >40)
LDL Cholesterol: 119 mg/dL — ABNORMAL HIGH (ref 0–99)
TRIGLYCERIDES: 75 mg/dL (ref <150)
Total CHOL/HDL Ratio: 4.8 RATIO
VLDL: 15 mg/dL (ref 0–40)

## 2018-03-06 LAB — URINALYSIS, ROUTINE W REFLEX MICROSCOPIC
Bacteria, UA: NONE SEEN
Bilirubin Urine: NEGATIVE
Glucose, UA: NEGATIVE mg/dL
HGB URINE DIPSTICK: NEGATIVE
Ketones, ur: NEGATIVE mg/dL
LEUKOCYTES UA: NEGATIVE
Nitrite: NEGATIVE
PROTEIN: 30 mg/dL — AB
SPECIFIC GRAVITY, URINE: 1.028 (ref 1.005–1.030)
pH: 7 (ref 5.0–8.0)

## 2018-03-06 LAB — HEMOGLOBIN A1C
Hgb A1c MFr Bld: 5.4 % (ref 4.8–5.6)
MEAN PLASMA GLUCOSE: 108.28 mg/dL

## 2018-03-06 LAB — TSH: TSH: 12.66 u[IU]/mL — AB (ref 0.350–4.500)

## 2018-03-06 LAB — I-STAT TROPONIN, ED: TROPONIN I, POC: 0.02 ng/mL (ref 0.00–0.08)

## 2018-03-06 LAB — APTT: APTT: 24 s (ref 24–36)

## 2018-03-06 LAB — VITAMIN B12: VITAMIN B 12: 704 pg/mL (ref 180–914)

## 2018-03-06 LAB — PROTIME-INR
INR: 1.11
PROTHROMBIN TIME: 14.2 s (ref 11.4–15.2)

## 2018-03-06 MED ORDER — ENOXAPARIN SODIUM 30 MG/0.3ML ~~LOC~~ SOLN
30.0000 mg | SUBCUTANEOUS | Status: DC
  Administered 2018-03-06 – 2018-03-13 (×7): 30 mg via SUBCUTANEOUS
  Filled 2018-03-06 (×8): qty 0.3

## 2018-03-06 MED ORDER — IOPAMIDOL (ISOVUE-370) INJECTION 76%
50.0000 mL | Freq: Once | INTRAVENOUS | Status: AC | PRN
  Administered 2018-03-06: 50 mL via INTRAVENOUS

## 2018-03-06 MED ORDER — SODIUM CHLORIDE 0.9 % IV SOLN
INTRAVENOUS | Status: DC
  Administered 2018-03-06 – 2018-03-08 (×5): via INTRAVENOUS
  Administered 2018-03-09: 1000 mL via INTRAVENOUS

## 2018-03-06 MED ORDER — ASPIRIN 81 MG PO CHEW
81.0000 mg | CHEWABLE_TABLET | Freq: Every day | ORAL | Status: DC
Start: 2018-03-06 — End: 2018-03-14
  Administered 2018-03-06 – 2018-03-14 (×7): 81 mg via ORAL
  Filled 2018-03-06 (×8): qty 1

## 2018-03-06 MED ORDER — ATORVASTATIN CALCIUM 40 MG PO TABS
40.0000 mg | ORAL_TABLET | Freq: Every day | ORAL | Status: DC
  Administered 2018-03-08 – 2018-03-13 (×4): 40 mg via ORAL
  Filled 2018-03-06: qty 2
  Filled 2018-03-06 (×4): qty 1

## 2018-03-06 MED ORDER — THYROID 30 MG PO TABS
30.0000 mg | ORAL_TABLET | Freq: Every day | ORAL | Status: DC
  Administered 2018-03-07 – 2018-03-14 (×6): 30 mg via ORAL
  Filled 2018-03-06 (×8): qty 1

## 2018-03-06 MED ORDER — SODIUM CHLORIDE 0.9 % IV BOLUS
1000.0000 mL | Freq: Once | INTRAVENOUS | Status: AC
  Administered 2018-03-06: 1000 mL via INTRAVENOUS

## 2018-03-06 NOTE — ED Notes (Signed)
Attempted report x2. RN states she received a new pt
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
## 2018-03-06 NOTE — Consult Note (Addendum)
Neurology Consultation  Reason for consultation: Possible Stroke.    HPI: Ronald English is a 84 y.o. male history of hypothyroidism, hypertension, hypercholesterolemia, carotid stenosis.  Patient was last seen normal at 10 AM today.  Symptoms started as a headache followed by left arm tingling followed by altered mental status.  Wife stated that she turned around and noticed that he was shaking all over - mostly in the arms - while in the standing position with arms flexed in front of him and then he slumped over.  Wife at that time called family member who then called EMS.  By the time EMS arrived he was extremely confused and unable to follow commands.   Per wife he does walk without a cane or walker.  He also is able to take care of himself.  While obtaining CT of head he continuously complained of a headache.  CT of head did not show any intracranial hemorrhage or acute hypodensity.  He does not have a history of migraine headaches.   LKW: 10 AM today tpa given?: no,  Premorbid modified Rankin scale (mRS): 4 NIH stroke scale 10  ROS:  Unable to obtain due to altered mental status.   Past Medical History:  Diagnosis Date  . Anteroseptal myocardial infarction 1993   2006 left main normal, right coronary artery patent stent, circumflex 30% marginal stenosis, LAD distal 90% stenosis. There is a patent proximal stent. He has apparently had Cypher stenting to the distal segment.  Marland Kitchen CAD (coronary artery disease)   . Carotid stenosis   . Hypercholesterolemia   . Hypertension   . Hypothyroidism   . Pneumonia    "once several years ago" (01/09/2013)  . Skin cancer of face    "couple times; off my forehead" (01/09/2013)  . Skull fracture    "when I was in the service" (01/09/2013)    Family History  Problem Relation Age of Onset  . Stroke Mother   . Angina Father      Social History:   reports that he quit smoking about 49 years ago. His smoking use included cigarettes. He has a  30.00 pack-year smoking history. He quit smokeless tobacco use about 5 years ago.  His smokeless tobacco use included chew. He reports that he drinks alcohol. He reports that he does not use drugs.  Medications No current facility-administered medications for this encounter.   Current Outpatient Medications:  .  atorvastatin (LIPITOR) 10 MG tablet, Take 1 tablet (10 mg total) by mouth daily at 6 PM., Disp: 30 tablet, Rfl: 0 .  clopidogrel (PLAVIX) 75 MG tablet, Take 1 tablet (75 mg total) by mouth daily., Disp: 30 tablet, Rfl: 0 .  Coenzyme Q10 (CO Q-10 PO), Take 1 tablet by mouth daily. , Disp: , Rfl:  .  Cyanocobalamin (B-12 PO), Take 1 tablet by mouth daily., Disp: , Rfl:  .  Iodine, Kelp, (KELP PO), Take 1 tablet by mouth daily. , Disp: , Rfl:  .  nitroGLYCERIN (NITROSTAT) 0.4 MG SL tablet, Place 1 tablet (0.4 mg total) under the tongue every 5 (five) minutes as needed for chest pain., Disp: 100 tablet, Rfl: 6 .  Thiamine HCl (VITAMIN B-1) 100 MG tablet, Take 100 mg by mouth daily.  , Disp: , Rfl:  .  thyroid (ARMOUR) 30 MG tablet, Take 30 mg by mouth daily.  , Disp: , Rfl:    Exam: Current vital signs: Wt 48.1 kg   BMI 16.12 kg/m  Vital signs in last  24 hours: Weight:  [48.1 kg] 48.1 kg (08/19 1100)  GENERAL: Awake, alert in NAD ABDOMEN - Soft, nontender, nondistended with normoactive BS Ext: warm, well perfused, intact peripheral pulses, no edema  NEURO:  Mental Status: Patient is alert but with poor orientation, able to state name but not age. Not oriented to time or place, but realizes he does not know. With several commands he has a comprehension deficit, but can follow some simple motor commands. He can only name a pen and could not name fingers. He could not give his birthday. CN: PERRL 2 mm/brisk. EOMI without nystagmus, visual fields full, no definite facial droop, possibly of facial sensory deficit on the left , hearing intact, tongue midline. Dysarthric in the context of  being edentulous.  Motor: Symmetric with all limbs antigravity and withdraws briskly with lower extremities bilaterally to plantar stimulation. Had difficulty following commands for testing of motor strength, but with coaching is symmetric with max strength elicited of 4/5 x 4. Tone is normal and bulk is normal Sensation-could not get an accurate sensory exam of the limbs secondary to confusion Coordination: Cannot follow commands for cerebellar exam --no clear ataxia with spontaneous movements Reflexes: Normoactive with no definite asymmetry Gait- deferred   Labs I have reviewed labs in epic and the results pertinent to this consultation are:   CBC    Component Value Date/Time   WBC 6.7 03/03/2014 0530   RBC 3.80 (L) 03/03/2014 0530   HGB 11.5 (L) 03/03/2014 0530   HCT 33.2 (L) 03/03/2014 0530   PLT 127 (L) 03/03/2014 0530   MCV 87.4 03/03/2014 0530   MCH 30.3 03/03/2014 0530   MCHC 34.6 03/03/2014 0530   RDW 13.2 03/03/2014 0530   LYMPHSABS 3.1 03/03/2014 0530   MONOABS 0.6 03/03/2014 0530   EOSABS 0.1 03/03/2014 0530   BASOSABS 0.1 03/03/2014 0530    CMP     Component Value Date/Time   NA 137 03/03/2014 0530   K 3.9 03/03/2014 0530   CL 103 03/03/2014 0530   CO2 25 03/03/2014 0530   GLUCOSE 99 03/03/2014 0530   BUN 15 03/03/2014 0530   CREATININE 0.85 03/03/2014 0530   CALCIUM 8.7 03/03/2014 0530   PROT 5.3 (L) 03/03/2014 0530   ALBUMIN 2.4 (L) 03/03/2014 0530   AST 21 03/03/2014 0530   ALT 12 03/03/2014 0530   ALKPHOS 43 03/03/2014 0530   BILITOT 0.5 03/03/2014 0530   GFRNONAA 77 (L) 03/03/2014 0530   GFRAA 90 (L) 03/03/2014 0530    Lipid Panel     Component Value Date/Time   CHOL 137 03/02/2014 0054   TRIG 147 03/02/2014 0054   HDL 18 (L) 03/02/2014 0054   CHOLHDL 7.6 03/02/2014 0054   VLDL 29 03/02/2014 0054   LDLCALC 90 03/02/2014 0054     Imaging I have reviewed the images obtained:  CT head:  1. Motion degraded examination. 2. New small  infarcts in the right caudate nucleus/right corona radiata and right occipital lobe of indeterminate acuity. 3. ASPECTS is 9. 4. No acute intracranial hemorrhage. 5. Bilateral temporal lobe encephalomalacia. 6. Moderate chronic small vessel ischemic disease and cerebral atrophy.  CTA head and neck, CTP:  1. No emergent large vessel occlusion. 2. 80% proximal right ICA and 65% proximal left ICA stenoses, progressed from 2015. 3. Mild-to-moderate bilateral intracranial ICA stenoses. 4. New severe left PCA stenoses. 5. Small focus of significantly delayed perfusion in the right parieto-occipital region without sizable acute core infarct based on RAPID  parameters. 6. Aortic Atherosclerosis (ICD10-I70.0) and Emphysema (ICD10-J43.9).    Assessment: 84 year old male presented to the hospital as a code stroke, due to sudden onset of confusion and left sided headache. No clear aphasia on exam, which was most consistent with speech deficit secondary to encephalopathy rather than stroke or TIA. Patient is complaining of left neck pain and remains confused, but this is improving in CT. 1. No significant lateralizing deficits on Neurology exam. Diffuse cerebral dysfunction secondary to toxic/metabolic/infectious etiology felt to be most likely.   2. No dissection on CTA, but with significant atherosclerotic disease. There is a small focus of significantly delayed perfusion in the right parieto-occipital region without sizable acute core infarct based on RAPID parameters.  3. Given stenotic segments on CTA, transient cerebral hypoperfusion may also be an etiology for his presentation. Diffuse coarse tremor and acute speech arrest is not uncommon with severe hypotension, which can be accompanied by watershed TIA symptoms if there is significant atherosclerotic narrowing.  4. EEG obtained after initial ED consultation by Neurology. The EEG is abnormal and findings are suggestive of mild generalized  cerebral dysfunction.  Epileptiform features were not seen during the recording  Recommendations:  1. MRI brain.  2. Echocardiogram 3. Vascular surgery consult for possible CEA after obtaining carotid ultrasound.  4. Continue Plavix and atorvastatin.   I have seen and examined the patient. I have amended the assessment and recommendations above.  Electronically signed: Dr. Kerney Elbe

## 2018-03-06 NOTE — Discharge Summary (Addendum)
Name: Ronald English MRN: [id number] DOB: 28-Dec-1925 84 y.o. PCP: Levin Erp, MD  Date of Admission: 03/06/2018 11:12 AM Date of Discharge: 03/14/18 Attending Physician: Axel Filler, *  Discharge Diagnosis: 1. Transient loss of consciousness 2. Vascular dementia 3. Delirium 4. Hypothyroidism 5. Dysphagia  Discharge Medications: Allergies as of 03/13/2018      Reactions   Codeine Itching      Medication List    STOP taking these medications   B-12 PO   clopidogrel 75 MG tablet Commonly known as:  PLAVIX   CO Q-10 PO   KELP PO   nitroGLYCERIN 0.4 MG SL tablet Commonly known as:  NITROSTAT   thiamine 100 MG tablet Commonly known as:  VITAMIN B-1     TAKE these medications   aspirin 81 MG chewable tablet Chew 1 tablet (81 mg total) by mouth daily. Start taking on:  03/14/2018   atorvastatin 40 MG tablet Commonly known as:  LIPITOR Take 1 tablet (40 mg total) by mouth daily at 6 PM. What changed:    medication strength  how much to take   nicotine 7 mg/24hr patch Commonly known as:  NICODERM CQ - dosed in mg/24 hr Place 1 patch (7 mg total) onto the skin daily. Start taking on:  03/14/2018   RESOURCE THICKENUP CLEAR Powd Take 120 g by mouth as needed.   risperiDONE 1 MG disintegrating tablet Commonly known as:  RISPERDAL M-TABS Take 1 tablet (1 mg total) by mouth at bedtime.   risperiDONE 0.5 MG disintegrating tablet Commonly known as:  RISPERDAL M-TABS Take 1 tablet (0.5 mg total) by mouth daily. Start taking on:  03/14/2018   thyroid 30 MG tablet Commonly known as:  ARMOUR Take 1 tablet (30 mg total) by mouth daily before breakfast. Start taking on:  03/14/2018 What changed:  when to take this       Disposition and follow-up:   [name] was discharged from [facility] in [place] condition.  At the hospital follow up visit please address:  1.   Dysphagia: Please keep patient on Dysphagia 1 diet (pureed  with nectar thick liquid)  End stage vascular dementia: Palliative care has evaluated patient in the hospital and recommend continued service with palliative care at SNF. He was started on risperidone to help with delirium.  Carotid artery disease: Evaluated by vascular surgery and no indication for intervention at this time; they recommend f/u in 6 months, however based on continued goals of care discussion would recommend focusing on comfort.  2.  Labs / imaging needed at time of follow-up: n/a  3.  Pending labs/ test needing follow-up: n/a  Follow-up Appointments: Contact information for after-discharge care    Destination    [facility]erred [facility] .   Service:  Skilled Nursing Contact information: [address] [place] [facility] Course by problem list:  Transient Loss of Consciousness: Patient presented to [facility] with transient loss of consciousness thought to be due to cerebral hypoperfusion in setting of mod to severe bilateral carotid artery atherosclerosis and orthostatic hypotension. Patient completed a stroke workup without evidence of acute stroke. Spot EEG was negative for evidence of epileptic changes. There was no evidence of infection. Patient was found to have under-treated hypothyroidism likely in setting of inability to take medications consistently, however this was not thought to be the main reason for his  presentation. Patient was evaluated by vascular surgery for possible intervention on bil carotid artery stenosis; they stated he would possibly be candidate for transcarotid arterial stenting due to his age, however did not think he had indication for urgent intervention and recommended he follow up in 6 months. He is on asa 81mg  daily and atorvastatin 40mg  daily; if discussion with family moves more towards comfort care, these medications may be discontinued if all parties in  agreement.  Hypothyroidism: Patient was found to have mildly elevated TSH (12) and low T4 (0.62); there was concern that he was not able to take his home medications regularly accounting for this mild elevation of TSH. He was continued on home armour 30mg  daily.   Vascular dementia Dysphagia: Patient with h/o dementia with imaging on admission consistent with vascular dementia; during hospitalization he was found to have dysphagia and was evaluated by speech therapy who recommended dysphagia 1 diet (pureed foods with nectar thick liquids). During hospitalization there was no evidence of acute aspiration event. Patient was also evaluated by palliative care who discussed goals of care with patient's family. They agree on DNR status and agreed on patient continuing palliative care services at SNF. He is not a current candidate for inpatient hospice.  Delirium: Patient experienced delirium during hospitalization in setting of vascular dementia, acute illness, and unfamiliar environment. He was started on risperidone which was titrated to 0.5mg  in the morning, and 1.0mg  at bedtime.   Discharge Vitals:   BP (!) 161/85 (BP Location: Left Arm)   Pulse 94   Temp 98.1 F (36.7 C) (Oral)   Resp 18   Ht 5\' 8"  (1.727 m)   Wt 48.1 kg   SpO2 96%   BMI 16.12 kg/m   Pertinent Labs, Studies, and Procedures:  CBC Latest Ref Rng & Units 03/10/2018 03/06/2018 03/06/2018  WBC 4.0 - 10.5 K/uL 13.6(H) - 11.6(H)  Hemoglobin 13.0 - 17.0 g/dL 12.4(L) 12.2(L) 12.3(L)  Hematocrit 39.0 - 52.0 % 35.9(L) 36.0(L) 38.1(L)  Platelets 150 - 400 K/uL 158 - 176   BMP Latest Ref Rng & Units 03/12/2018 03/10/2018 03/07/2018  Glucose 70 - 99 mg/dL 92 104(H) 94  BUN 8 - 23 mg/dL 36(H) 15 18  Creatinine 0.61 - 1.24 mg/dL 1.21 1.20 1.11  Sodium 135 - 145 mmol/L 141 138 139  Potassium 3.5 - 5.1 mmol/L 3.7 3.2(L) 3.7  Chloride 98 - 111 mmol/L 105 102 105  CO2 22 - 32 mmol/L 22 24 26   Calcium 8.9 - 10.3 mg/dL 9.4 9.2 9.0   TSH  8/19: 12.66 T4 8/19: 0.62 Vitamin B12 8/19: 704  CT head wo contrast 03/06/18: 1. Motion degraded examination. 2. New small infarcts in the right caudate nucleus/right corona radiata and right occipital lobe of indeterminate acuity. 3. ASPECTS is 9. 4. No acute intracranial hemorrhage. 5. Bilateral temporal lobe encephalomalacia. 6. Moderate chronic small vessel ischemic disease and cerebral Atrophy  CT angio head/neck w/wo contrast 03/06/18: 1. No emergent large vessel occlusion. 2. 80% proximal right ICA and 65% proximal left ICA stenoses, progressed from 2015. 3. Mild-to-moderate bilateral intracranial ICA stenoses. 4. New severe left PCA stenoses. 5. Small focus of significantly delayed perfusion in the right parieto-occipital region without sizable acute core infarct based on RAPID parameters. 6. Aortic Atherosclerosis (ICD10-I70.0) and Emphysema (ICD10-J43.9)   Discharge Instructions: Discharge Instructions    Amb Referral to Palliative Care   Complete by:  As directed    Call MD for:  difficulty breathing, headache or visual disturbances  Complete by:  As directed    Call MD for:  extreme fatigue   Complete by:  As directed    Call MD for:  hives   Complete by:  As directed    Call MD for:  persistant dizziness or light-headedness   Complete by:  As directed    Call MD for:  persistant nausea and vomiting   Complete by:  As directed    Call MD for:  redness, tenderness, or signs of infection (pain, swelling, redness, odor or green/yellow discharge around incision site)   Complete by:  As directed    Call MD for:  severe uncontrolled pain   Complete by:  As directed    Call MD for:  temperature >100.4   Complete by:  As directed    Discharge instructions   Complete by:  As directed    Diet: dysphagia 1 (pureed foods with nectar thick liquids) Palliative care referral has been placed; please continue addressing goals of care with family and ensuring patient's  comfort.   Increase activity slowly   Complete by:  As directed       Signed: Alphonzo Grieve, MD 03/13/2018, 4:25 PM   Pager: [phone]

## 2018-03-06 NOTE — H&P (Signed)
Date: 03/06/2018               Patient Name:  Ronald English MRN: [id number]  DOB: 03-24-1926 Age / Sex: 84 y.o., male   PCP: Levin Erp, MD         Medical Service: Internal Medicine Teaching Service         Attending Physician: Dr. Evette Doffing, Mallie Mussel, *    First Contact: Dr. Sherry Ruffing Pager: [phone]  Second Contact: Dr. Hetty Ely Pager: [phone]       After Hours (After 5p/  First Contact Pager: [phone]  weekends / holidays): Second Contact Pager: [phone]   Chief Complaint: Altered mental status  History of Present Illness: This is a 84 year old male with a history of hypothyroidism, HLD, HTN, CVA (2015) and carotid stenosis presenting with altered mental status. He was at home when he sudden onset of a headache, bilateral arm tremor, and speech difficulty. The wife was present at the time and provided the information for what happened. She states that they were in the kitchen when he became very quiet and would not talk. She went to the other room to call 911 and when she returned she noticed that he was slumped over the chair and not responding, he responded when she shook up and when he woke up he was very confused. They denied any fevers, chills, nausea, vomiting, diarrhea or constipation. He does have a history of CVA in 2015 in his right centrum semi ovale, infarct was felt to be 2/2 small vessel disease. Echocardiogram in 8/15 showed EF 55-60%, with a normal diastolic function.   In the ED he was afebrile, tachypneic to 25, oxygenating at 89%, HR 86, BP 177/71. He had a initial NIHSS of 10, bilateral leg weakness and inability to follow commands and answer questions. CMP showed BUN 24 and Cr 1.34, elevated from baseline of BUN 15, Cr 0.85. CBC showed WBC of 11.6, Hgb 12.8 (near baseline), MCV 93, and Plt 176. CT showed new small infarcts in the right caudate nucleus/right corona radiata and rihgt occipital lobe of indeterminate acuity. CTA head and neck showed no large vessel  occlusion, 80% proximal right ICA and 65% proximal left ICA stenosis, bilateral intracranial ICA, new severe left PCA stenosis, small focus of delayed perfusion in right parieto-occiputal region. Neurology consult, they did not think this was a stroke, had significant lateralizing deficits. Patient was admitted to internal medicine.    Meds:  Scheduled Meds: . aspirin  81 mg Oral Daily  . enoxaparin (LOVENOX) injection  30 mg Subcutaneous Q24H   Continuous Infusions: . sodium chloride 90 mL/hr at 03/06/18 1753   PRN Meds:.  Current Meds  Medication Sig  . Coenzyme Q10 (CO Q-10 PO) Take 1 tablet by mouth daily.   . Cyanocobalamin (B-12 PO) Take 1 tablet by mouth daily.  . Iodine, Kelp, (KELP PO) Take 1 tablet by mouth daily.   . nitroGLYCERIN (NITROSTAT) 0.4 MG SL tablet Place 1 tablet (0.4 mg total) under the tongue every 5 (five) minutes as needed for chest pain.  . Thiamine HCl (VITAMIN B-1) 100 MG tablet Take 100 mg by mouth daily.    Marland Kitchen thyroid (ARMOUR) 30 MG tablet Take 30 mg by mouth daily.       Allergies: Allergies as of 03/06/2018 - Review Complete 03/06/2018  Allergen Reaction Noted  . Codeine Itching 01/18/2011   Past Medical History:  Diagnosis Date  . Anteroseptal myocardial infarction [facility]) 1993  2006 left main normal, right coronary artery patent stent, circumflex 30% marginal stenosis, LAD distal 90% stenosis. There is a patent proximal stent. He has apparently had Cypher stenting to the distal segment.  Marland Kitchen CAD (coronary artery disease)   . Carotid stenosis   . Hypercholesterolemia   . Hypertension   . Hypothyroidism   . Pneumonia    "once several years ago" (01/09/2013)  . Skin cancer of face    "couple times; off my forehead" (01/09/2013)  . Skull fracture ([place])    "when I was in the service" (01/09/2013)    Family History: Brother had a stroke, father had a heart attack.   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 He lived [place] farm in  [place] which is where he grew up, has lived in [place] since 1952 with his wife. Denies EtOH, drug and smoking, quit smoking 30 years ago and drinking 15-20 years ago.   Review of Systems: A complete ROS was negative except as per HPI.   Physical Exam: Blood pressure (!) 132/46, pulse (!) 49, temperature 98.1 F (36.7 C), temperature source Oral, resp. rate 17, height 5\' 8"  (1.727 m), weight 48.1 kg, SpO2 98 %. General: alert, appears older than stated age, cachectic and no distress HEENT: PERRLA, dark area on left medial eye Heart: S1, S2 normal, no murmur, rub or gallop, regular rate and rhythm Lungs: clear to auscultation, no wheezes or rales and unlabored breathing Abdomen: abdomen is soft without significant tenderness, masses, organomegaly or guarding Extremities: extremities normal, atraumatic, no cyanosis or edema Musculoskeletal: no joint tenderness, deformity or swelling Neurology: Alert, oriented to person and place but not time, follows commands, PERRLA, EOMI intact, 4/5 strength in upper and lower extremities, normal tone Psychiatry: Normal mood and affect  EKG: personally reviewed my interpretation is lots of artifacts, normal sinus rhythm, no obvious st changes  CT head: IMPRESSION: 1. Motion degraded examination. 2. New small infarcts in the right caudate nucleus/right corona radiata and right occipital lobe of indeterminate acuity. 3. ASPECTS is 9. 4. No acute intracranial hemorrhage. 5. Bilateral temporal lobe encephalomalacia. 6. Moderate chronic small vessel ischemic disease and cerebral Atrophy.  CTA head/neck, cerebral perfusion IMPRESSION: 1. No emergent large vessel occlusion. 2. 80% proximal right ICA and 65% proximal left ICA stenoses, progressed from 2015. 3. Mild-to-moderate bilateral intracranial ICA stenoses. 4. New severe left PCA stenoses. 5. Small focus of significantly delayed perfusion in the right parieto-occipital region without  sizable acute core infarct based on RAPID parameters. 6. Aortic Atherosclerosis (ICD10-I70.0) and Emphysema (ICD10-J43.9).  Assessment & Plan by Problem: This is a 84 year old male with a history of hypothyroidism, HLD, HTN, carotid stenosis presenting with altered mental status.  Altered mental status: Patient presented after wife found him to have a sudden headache,, generalized weakness, and an episode of shaking. He did not have any other signs of seizures such as bladder incontinence or tongue biting, does not have a history of seizures. Previous CVA in right centrum semi ovale. On exam he did not have any focal findings, just generalized weakness. CT scan showed infarcts of the right caudate, but neurology thinks that this is old and that his cause is likely vascular in origin. Given the sudden onset and improvement of his symptoms this it possibly a TIA. He does have severe atherosclerosis noted on CTA which could be the cause.  -Neurology consult: Does not think this is a stroke, more consistent with encephalopathy, no significant  lateralizing deficits on exam -MRI -ASA 81 mg daily -Lipitor 40 mg -Vascular surgery consult for possible CEA -EEG -Delirium precautions -Echocardiogram -Check A1c and lipid panel -Telemetry  AKI: BUN 24, Cr 1.34 on admission, elevated from the last BUN 15 and Cr 0.85 in 2015 -Condom cath on -On NS 90cc/hr maintenance fluids -BMP in AM  Hypothyroidism: -Continue Armour 30mg  daily -Check TSH  FEN: NS 90cc/hr, replete lytes prn, regualr diet VTE ppx: Lovenox Code Status: FULL    Dispo: Admit patient to Observation with expected length of stay less than 2 midnights.  Signed: Asencion Noble, MD 03/06/2018, 6:49 PM  Pager: [phone]

## 2018-03-06 NOTE — ED Notes (Signed)
ED TO INPATIENT HANDOFF REPORT  Name/Age/Gender Ronald English 84 y.o. male  Code Status    Code Status Orders  (From admission, onward)         Start     Ordered   03/06/18 1605  Full code  Continuous     03/06/18 1604        Code Status History    Date Active Date Inactive Code Status Order ID Comments User Context   02/28/2014 1853 03/03/2014 1420 Full Code [id number]  Shanda Howells, MD ED      Home/SNF/Other Home  Chief Complaint code stroke  Level of Care/Admitting Diagnosis ED Disposition    ED Disposition Condition [place]: [place] [[id number]]  Level of Care: Telemetry [5]  Diagnosis: Altered mental status [780.97.ICD-9-CM]  Admitting Physician: Axel Filler [[id number]]  Attending Physician: Axel Filler [phone]  PT Class (Do Not Modify): Observation [104]  PT Acc Code (Do Not Modify): Observation [10022]       Medical History Past Medical History:  Diagnosis Date  . Anteroseptal myocardial infarction [facility]) 1993   2006 left main normal, right coronary artery patent stent, circumflex 30% marginal stenosis, LAD distal 90% stenosis. There is a patent proximal stent. He has apparently had Cypher stenting to the distal segment.  Marland Kitchen CAD (coronary artery disease)   . Carotid stenosis   . Hypercholesterolemia   . Hypertension   . Hypothyroidism   . Pneumonia    "once several years ago" (01/09/2013)  . Skin cancer of face    "couple times; off my forehead" (01/09/2013)  . Skull fracture ([place])    "when I was in the service" (01/09/2013)    Allergies Allergies  Allergen Reactions  . Codeine Itching    IV Location/Drains/Wounds Patient Lines/Drains/Airways Status   Active Line/Drains/Airways    Name:   Placement date:   Placement time:   Site:   Days:   Peripheral IV 03/06/18 Left;Distal Forearm   03/06/18    1157    Forearm   less than 1   Peripheral IV 03/06/18 Right;Distal Forearm    03/06/18    1115    Forearm   less than 1   Peripheral IV 03/06/18 Left Antecubital   03/06/18    1209    Antecubital   less than 1   External Urinary Catheter   03/06/18    1313    -   less than 1          Labs/Imaging Results for orders placed or performed during the hospital encounter of 03/06/18 (from the past 48 hour(s))  Protime-INR     Status: None   Collection Time: 03/06/18 11:14 AM  Result Value Ref Range   Prothrombin Time 14.2 11.4 - 15.2 seconds   INR 1.11     Comment: Performed at [facility], [place] [address], [place], [place] [address]  APTT     Status: None   Collection Time: 03/06/18 11:14 AM  Result Value Ref Range   aPTT 24 24 - 36 seconds    Comment: Performed at [place] [address] [place] [address]  CBC     Status: Abnormal   Collection Time: 03/06/18 11:14 AM  Result Value Ref Range   WBC 11.6 (H) 4.0 - 10.5 K/uL   RBC 4.08 (L) 4.22 - 5.81 MIL/uL   Hemoglobin 12.3 (L) 13.0 - 17.0 g/dL   HCT  38.1 (L) 39.0 - 52.0 %   MCV 93.4 78.0 - 100.0 fL   MCH 30.1 26.0 - 34.0 pg   MCHC 32.3 30.0 - 36.0 g/dL   RDW 12.7 11.5 - 15.5 %   Platelets 176 150 - 400 K/uL    Comment: Performed at [facility] [address] [place], [place] [address]  Differential     Status: Abnormal   Collection Time: 03/06/18 11:14 AM  Result Value Ref Range   Neutrophils Relative % 48 %   Neutro Abs 5.5 1.7 - 7.7 K/uL   Lymphocytes Relative 25 %   Lymphs Abs 2.9 0.7 - 4.0 K/uL   Monocytes Relative 6 %   Monocytes Absolute 0.7 0.1 - 1.0 K/uL   Eosinophils Relative 20 %   Eosinophils Absolute 2.3 (H) 0.0 - 0.7 K/uL   Basophils Relative 1 %   Basophils Absolute 0.1 0.0 - 0.1 K/uL   Immature Granulocytes 0 %   Abs Immature Granulocytes 0.0 0.0 - 0.1 K/uL    Comment: Performed at [facility] [address], [place], [place] [address]  Comprehensive metabolic panel     Status: Abnormal   Collection Time: 03/06/18 11:14 AM  Result Value  Ref Range   Sodium 137 135 - 145 mmol/L   Potassium 3.6 3.5 - 5.1 mmol/L   Chloride 100 98 - 111 mmol/L   CO2 21 (L) 22 - 32 mmol/L   Glucose, Bld 136 (H) 70 - 99 mg/dL   BUN 24 (H) 8 - 23 mg/dL   Creatinine, Ser 1.34 (H) 0.61 - 1.24 mg/dL   Calcium 9.8 8.9 - 10.3 mg/dL   Total Protein 6.8 6.5 - 8.1 g/dL   Albumin 4.0 3.5 - 5.0 g/dL   AST 33 15 - 41 U/L   ALT 15 0 - 44 U/L   Alkaline Phosphatase 50 38 - 126 U/L   Total Bilirubin 1.0 0.3 - 1.2 mg/dL   GFR calc non Af Amer 45 (L) >60 mL/min   GFR calc Af Amer 52 (L) >60 mL/min    Comment: (NOTE) The eGFR has been calculated using the CKD EPI equation. This calculation has not been validated in all clinical situations. eGFR's persistently <60 mL/min signify possible Chronic Kidney Disease.    Anion gap 16 (H) 5 - 15    Comment: Performed at [facility], [place] [address], [place], [place] [address]  I-stat troponin, ED     Status: None   Collection Time: 03/06/18 11:30 AM  Result Value Ref Range   Troponin i, poc 0.02 0.00 - 0.08 ng/mL   Comment 3            Comment: Due to the release kinetics of cTnI, a negative result within the first hours of the onset of symptoms does not rule out myocardial infarction with certainty. If myocardial infarction is still suspected, repeat the test at appropriate intervals.   I-Stat Chem 8, ED     Status: Abnormal   Collection Time: 03/06/18 11:32 AM  Result Value Ref Range   Sodium 137 135 - 145 mmol/L   Potassium 3.6 3.5 - 5.1 mmol/L   Chloride 100 98 - 111 mmol/L   BUN 26 (H) 8 - 23 mg/dL   Creatinine, Ser 1.30 (H) 0.61 - 1.24 mg/dL   Glucose, Bld 128 (H) 70 - 99 mg/dL   Calcium, Ion 1.21 1.15 - 1.40 mmol/L   TCO2 21 (L) 22 - 32  mmol/L   Hemoglobin 12.2 (L) 13.0 - 17.0 g/dL   HCT 36.0 (L) 39.0 - 52.0 %   Ct Angio Head W Or Wo Contrast  Result Date: 03/06/2018 CLINICAL DATA:  Left-sided weakness. Headache. EXAM: CT ANGIOGRAPHY HEAD AND NECK CT PERFUSION BRAIN TECHNIQUE:  Multidetector CT imaging of the head and neck was performed using the standard protocol during bolus administration of intravenous contrast. Multiplanar CT image reconstructions and MIPs were obtained to evaluate the vascular anatomy. Carotid stenosis measurements (when applicable) are obtained utilizing NASCET criteria, using the distal internal carotid diameter as the denominator. Multiphase CT imaging of the brain was performed following IV bolus contrast injection. Subsequent parametric perfusion maps were calculated using RAPID software. CONTRAST:  61m ISOVUE-370 IOPAMIDOL (ISOVUE-370) INJECTION 76% COMPARISON:  Head and neck CTA 03/02/2014 FINDINGS: CTA NECK FINDINGS Aortic arch: The aortic arch was incompletely imaged with exclusion of the brachiocephalic and left common carotid artery origins. There is moderate aortic arch atherosclerosis. Plaque at the left subclavian artery origin does not result in significant stenosis. Right carotid system: Patent with moderate, predominantly calcified plaque at the carotid bifurcation and throughout the proximal ICA. Maximal proximal ICA stenosis is approximately 80%, increased from prior. Left carotid system: Incomplete imaging of the proximal common carotid artery. Predominantly calcified plaque in the proximal ICA results in 65% stenosis, increased from prior. Vertebral arteries: The vertebral arteries are patent with the left being dominant. Calcified plaque near both vertebral artery origins results in mild stenosis on the right, similar to prior. Skeleton: Advanced cervical disc and facet degeneration. Grade 1 anterolisthesis of C3 on C4 and C7 on T1. Other neck: No evidence of acute abnormality or mass. Upper chest: Biapical pleural-parenchymal scarring and centrilobular emphysema. Review of the MIP images confirms the above findings CTA HEAD FINDINGS Anterior circulation: The internal carotid arteries are patent from skull base to carotid termini with calcified  plaque resulting in mild-to-moderate proximal supraclinoid ICA stenosis bilaterally, stable to slightly progressed. ACAs and MCAs are patent without evidence of proximal branch occlusion. There is a new moderate right A1 stenosis. Anterior circulation branch vessel irregularity is noted bilaterally, and there is a new moderate right A2 stenosis. No aneurysm is identified. Posterior circulation: The intracranial vertebral arteries are patent to the basilar. Mild left V4 segment atherosclerosis does not result in significant stenosis. Patent PICA and SCA origins are visualized bilaterally. The basilar artery is widely patent. Posterior communicating arteries are not clearly identified although there may be a diminutive vessel on the right. The PCAs are patent with new severe distal left P1 and P2 stenoses as well as bilateral PCA branch vessel attenuation and irregularity more distally. No aneurysm is identified. Venous sinuses: Not well evaluated due to arterial contrast timing. Anatomic variants: None. Review of the MIP images confirms the above findings CT Brain Perfusion Findings: CBF (<30%) Volume: 0 mL Perfusion (Tmax>6.0s) volume: 6 mL, right parieto-occipital region Mismatch Volume: 6 mL IMPRESSION: 1. No emergent large vessel occlusion. 2. 80% proximal right ICA and 65% proximal left ICA stenoses, progressed from 2015. 3. Mild-to-moderate bilateral intracranial ICA stenoses. 4. New severe left PCA stenoses. 5. Small focus of significantly delayed perfusion in the right parieto-occipital region without sizable acute core infarct based on RAPID parameters. 6. Aortic Atherosclerosis (ICD10-I70.0) and Emphysema (ICD10-J43.9). These results were communicated to Dr. LCheral Markerat 140:[id number] on 03/06/2018 by text page via the [facility]aging system. Electronically Signed   By: ALogan BoresM.D.   On: 03/06/2018 13:02  Ct Angio Neck W Or Wo Contrast  Result Date: 03/06/2018 CLINICAL DATA:  Left-sided weakness.  Headache. EXAM: CT ANGIOGRAPHY HEAD AND NECK CT PERFUSION BRAIN TECHNIQUE: Multidetector CT imaging of the head and neck was performed using the standard protocol during bolus administration of intravenous contrast. Multiplanar CT image reconstructions and MIPs were obtained to evaluate the vascular anatomy. Carotid stenosis measurements (when applicable) are obtained utilizing NASCET criteria, using the distal internal carotid diameter as the denominator. Multiphase CT imaging of the brain was performed following IV bolus contrast injection. Subsequent parametric perfusion maps were calculated using RAPID software. CONTRAST:  45m ISOVUE-370 IOPAMIDOL (ISOVUE-370) INJECTION 76% COMPARISON:  Head and neck CTA 03/02/2014 FINDINGS: CTA NECK FINDINGS Aortic arch: The aortic arch was incompletely imaged with exclusion of the brachiocephalic and left common carotid artery origins. There is moderate aortic arch atherosclerosis. Plaque at the left subclavian artery origin does not result in significant stenosis. Right carotid system: Patent with moderate, predominantly calcified plaque at the carotid bifurcation and throughout the proximal ICA. Maximal proximal ICA stenosis is approximately 80%, increased from prior. Left carotid system: Incomplete imaging of the proximal common carotid artery. Predominantly calcified plaque in the proximal ICA results in 65% stenosis, increased from prior. Vertebral arteries: The vertebral arteries are patent with the left being dominant. Calcified plaque near both vertebral artery origins results in mild stenosis on the right, similar to prior. Skeleton: Advanced cervical disc and facet degeneration. Grade 1 anterolisthesis of C3 on C4 and C7 on T1. Other neck: No evidence of acute abnormality or mass. Upper chest: Biapical pleural-parenchymal scarring and centrilobular emphysema. Review of the MIP images confirms the above findings CTA HEAD FINDINGS Anterior circulation: The internal  carotid arteries are patent from skull base to carotid termini with calcified plaque resulting in mild-to-moderate proximal supraclinoid ICA stenosis bilaterally, stable to slightly progressed. ACAs and MCAs are patent without evidence of proximal branch occlusion. There is a new moderate right A1 stenosis. Anterior circulation branch vessel irregularity is noted bilaterally, and there is a new moderate right A2 stenosis. No aneurysm is identified. Posterior circulation: The intracranial vertebral arteries are patent to the basilar. Mild left V4 segment atherosclerosis does not result in significant stenosis. Patent PICA and SCA origins are visualized bilaterally. The basilar artery is widely patent. Posterior communicating arteries are not clearly identified although there may be a diminutive vessel on the right. The PCAs are patent with new severe distal left P1 and P2 stenoses as well as bilateral PCA branch vessel attenuation and irregularity more distally. No aneurysm is identified. Venous sinuses: Not well evaluated due to arterial contrast timing. Anatomic variants: None. Review of the MIP images confirms the above findings CT Brain Perfusion Findings: CBF (<30%) Volume: 0 mL Perfusion (Tmax>6.0s) volume: 6 mL, right parieto-occipital region Mismatch Volume: 6 mL IMPRESSION: 1. No emergent large vessel occlusion. 2. 80% proximal right ICA and 65% proximal left ICA stenoses, progressed from 2015. 3. Mild-to-moderate bilateral intracranial ICA stenoses. 4. New severe left PCA stenoses. 5. Small focus of significantly delayed perfusion in the right parieto-occipital region without sizable acute core infarct based on RAPID parameters. 6. Aortic Atherosclerosis (ICD10-I70.0) and Emphysema (ICD10-J43.9). These results were communicated to Dr. LCheral Markerat 110:[phone] on 03/06/2018 by text page via the [facility] Caremessaging system. Electronically Signed   By: ALogan BoresM.D.   On: 03/06/2018 13:02   Ct Cerebral Perfusion  W Contrast  Result Date: 03/06/2018 CLINICAL DATA:  Left-sided weakness. Headache. EXAM: CT ANGIOGRAPHY HEAD  AND NECK CT PERFUSION BRAIN TECHNIQUE: Multidetector CT imaging of the head and neck was performed using the standard protocol during bolus administration of intravenous contrast. Multiplanar CT image reconstructions and MIPs were obtained to evaluate the vascular anatomy. Carotid stenosis measurements (when applicable) are obtained utilizing NASCET criteria, using the distal internal carotid diameter as the denominator. Multiphase CT imaging of the brain was performed following IV bolus contrast injection. Subsequent parametric perfusion maps were calculated using RAPID software. CONTRAST:  84m ISOVUE-370 IOPAMIDOL (ISOVUE-370) INJECTION 76% COMPARISON:  Head and neck CTA 03/02/2014 FINDINGS: CTA NECK FINDINGS Aortic arch: The aortic arch was incompletely imaged with exclusion of the brachiocephalic and left common carotid artery origins. There is moderate aortic arch atherosclerosis. Plaque at the left subclavian artery origin does not result in significant stenosis. Right carotid system: Patent with moderate, predominantly calcified plaque at the carotid bifurcation and throughout the proximal ICA. Maximal proximal ICA stenosis is approximately 80%, increased from prior. Left carotid system: Incomplete imaging of the proximal common carotid artery. Predominantly calcified plaque in the proximal ICA results in 65% stenosis, increased from prior. Vertebral arteries: The vertebral arteries are patent with the left being dominant. Calcified plaque near both vertebral artery origins results in mild stenosis on the right, similar to prior. Skeleton: Advanced cervical disc and facet degeneration. Grade 1 anterolisthesis of C3 on C4 and C7 on T1. Other neck: No evidence of acute abnormality or mass. Upper chest: Biapical pleural-parenchymal scarring and centrilobular emphysema. Review of the MIP images confirms  the above findings CTA HEAD FINDINGS Anterior circulation: The internal carotid arteries are patent from skull base to carotid termini with calcified plaque resulting in mild-to-moderate proximal supraclinoid ICA stenosis bilaterally, stable to slightly progressed. ACAs and MCAs are patent without evidence of proximal branch occlusion. There is a new moderate right A1 stenosis. Anterior circulation branch vessel irregularity is noted bilaterally, and there is a new moderate right A2 stenosis. No aneurysm is identified. Posterior circulation: The intracranial vertebral arteries are patent to the basilar. Mild left V4 segment atherosclerosis does not result in significant stenosis. Patent PICA and SCA origins are visualized bilaterally. The basilar artery is widely patent. Posterior communicating arteries are not clearly identified although there may be a diminutive vessel on the right. The PCAs are patent with new severe distal left P1 and P2 stenoses as well as bilateral PCA branch vessel attenuation and irregularity more distally. No aneurysm is identified. Venous sinuses: Not well evaluated due to arterial contrast timing. Anatomic variants: None. Review of the MIP images confirms the above findings CT Brain Perfusion Findings: CBF (<30%) Volume: 0 mL Perfusion (Tmax>6.0s) volume: 6 mL, right parieto-occipital region Mismatch Volume: 6 mL IMPRESSION: 1. No emergent large vessel occlusion. 2. 80% proximal right ICA and 65% proximal left ICA stenoses, progressed from 2015. 3. Mild-to-moderate bilateral intracranial ICA stenoses. 4. New severe left PCA stenoses. 5. Small focus of significantly delayed perfusion in the right parieto-occipital region without sizable acute core infarct based on RAPID parameters. 6. Aortic Atherosclerosis (ICD10-I70.0) and Emphysema (ICD10-J43.9). These results were communicated to Dr. LCheral Markerat [phone] on 03/06/2018 by text page via the [facility]aging system. Electronically Signed    By: ALogan BoresM.D.   On: 03/06/2018 13:02   Ct Head Code Stroke Wo Contrast  Result Date: 03/06/2018 CLINICAL DATA:  Code stroke.  Left-sided weakness.  Headache. EXAM: CT HEAD WITHOUT CONTRAST TECHNIQUE: Contiguous axial images were obtained from the base of the skull through the vertex without  intravenous contrast. COMPARISON:  03/02/2014 FINDINGS: The study is motion degraded. Brain: There are new infarcts involving the right caudate nucleus and corona radiata of indeterminate acuity. There is also a new small infarct in the right occipital lobe of indeterminate acuity, with assessment limited by motion artifact. Encephalomalacia/chronic infarcts are present in both temporal lobes and in the right frontal operculum. No acute intracranial hemorrhage, mass, midline shift, or extra-axial fluid collection is identified. Patchy cerebral white matter hypodensities have progressed from 2015 and are nonspecific but compatible with moderate chronic small vessel ischemic disease. There is moderate cerebral atrophy. Vascular: Calcified atherosclerosis at the skull base. No hyperdense vessel. Skull: No fracture or suspicious osseous lesion. Sinuses/Orbits: Paranasal sinuses and mastoid air cells are clear. Bilateral cataract extraction. Other: None. ASPECTS [facility] Stroke Program Early CT Score) - Ganglionic level infarction (caudate, lentiform nuclei, internal capsule, insula, M1-M3 cortex): 6 - Supraganglionic infarction (M4-M6 cortex): 3 Total score (0-10 with 10 being normal): 9 IMPRESSION: 1. Motion degraded examination. 2. New small infarcts in the right caudate nucleus/right corona radiata and right occipital lobe of indeterminate acuity. 3. ASPECTS is 9. 4. No acute intracranial hemorrhage. 5. Bilateral temporal lobe encephalomalacia. 6. Moderate chronic small vessel ischemic disease and cerebral atrophy. These results were communicated to Dr. Cheral Marker at 12:25 pm on 03/06/2018 by text page via the [facility]  messaging system. Electronically Signed   By: Logan Bores M.D.   On: 03/06/2018 12:31    Pending Labs Unresulted Labs (From admission, onward)    Start     Ordered   03/07/18 0500  TSH  Tomorrow morning,   R     03/06/18 1515   03/07/18 0102  Basic metabolic panel  Tomorrow morning,   R     03/06/18 1515   03/07/18 0500  Lipid panel  Tomorrow morning,   R     03/06/18 1519   03/07/18 0500  Vitamin B12  Tomorrow morning,   R     03/06/18 1616   03/06/18 1617  Urinalysis, Routine w reflex microscopic  STAT,   R     03/06/18 1616   03/06/18 1225  Urine culture  STAT,   STAT     03/06/18 1225          Vitals/Pain Today's Vitals   03/06/18 1815 03/06/18 1830 03/06/18 1900 03/06/18 1915  BP: (!) 144/53 (!) 132/46 (!) 140/54 (!) 147/52  Pulse: (!) 43 (!) 49 (!) 46 (!) 53  Resp: '20 17 17 ' (!) 21  Temp:      TempSrc:      SpO2: 99% 98% 97% (!) 88%  Weight:      Height:      PainSc:        Isolation Precautions No active isolations  Medications Medications  enoxaparin (LOVENOX) injection 30 mg (has no administration in time range)  0.9 %  sodium chloride infusion ( Intravenous New Bag/Given 03/06/18 1753)  aspirin chewable tablet 81 mg (81 mg Oral Given 03/06/18 1902)  iopamidol (ISOVUE-370) 76 % injection 50 mL (50 mLs Intravenous Contrast Given 03/06/18 1149)  sodium chloride 0.9 % bolus 1,000 mL (0 mLs Intravenous Stopped 03/06/18 1419)    Mobility manual wheelchair at hospital, walks alone at home

## 2018-03-06 NOTE — ED Triage Notes (Signed)
Pt brought in by [facility]. Code stroke called PTA. Pt was sitting at kitchen table when he complained of a HA and left arm numbness and tingling, also had total body tremors per wife. Baseline pt is alert and orientation and walks without assistance. Pt cleared by EDP and brought to [facility] [place] with neurology. Pt brought to exam room after CT scan. Call bell in reach, bed locked and lowered. Vital signs stable.

## 2018-03-06 NOTE — ED Notes (Signed)
Regular diet meal tray ordered for pt at this time.

## 2018-03-06 NOTE — Progress Notes (Signed)
EEG completed, results pending. 

## 2018-03-06 NOTE — ED Notes (Signed)
Condom catheter placed on pt at this time; pt informed urine sample is needed.
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
## 2018-03-06 NOTE — Procedures (Signed)
Date of recording 03/05/2018  Referring physician Leonel Ramsay  Reason for the study History of cardiac arrest currently intubated with sedation with propofol  Technical Digital EEG recording using 10-20 international electrode system  Description of the recording Posterior dominant rhythm is 7 to 8 Hz symmetrical and reactive During drowsiness mild slowing noticed. Non-REM stage II sleep was not obtained Epileptiform features were not seen during this recording  Impression   The EEG is abnormal and findings are suggestive of mild generalized cerebral dysfunction.  Epileptiform features were not seen during this recording

## 2018-03-06 NOTE — Code Documentation (Signed)
84 yo Male coming from home where he lives with his wife with complaints of sudden onset of headache, bilateral arm tremor, and trouble getting his words out. Wife reported that patient woke up this morning and walked into the kitchen to get his coffee. After he got his coffee, he sat at the kitchen table and started to complain of a headache. She turned around to get food and when she turned back, she reports that both his arms were shaking and he slumped down in his chair. Reports, "I thought he had passed. He slumped down and his eyes rolled back a little bit. He kept saying he couldn't tell me what's wrong." EMS was called and activated a Code Stroke. Stroke Team met patient upon arrival. Initial NIHSS 10 due to bilateral leg weakness, inability to follow both commands, and inability to answer questions. CT Head was negative and CTA/CTP did not show signs of LVO. tPA not given due to Not suspected stroke. Family spoken with and confirmed story. Handoff given to [place], Therapist, sports.

## 2018-03-07 ENCOUNTER — Observation Stay (HOSPITAL_COMMUNITY): Payer: Medicare Other

## 2018-03-07 ENCOUNTER — Other Ambulatory Visit (HOSPITAL_COMMUNITY): Payer: Self-pay

## 2018-03-07 DIAGNOSIS — R55 Syncope and collapse: Secondary | ICD-10-CM | POA: Diagnosis not present

## 2018-03-07 DIAGNOSIS — F0151 Vascular dementia with behavioral disturbance: Secondary | ICD-10-CM | POA: Diagnosis present

## 2018-03-07 DIAGNOSIS — G9340 Encephalopathy, unspecified: Secondary | ICD-10-CM | POA: Diagnosis not present

## 2018-03-07 DIAGNOSIS — J439 Emphysema, unspecified: Secondary | ICD-10-CM | POA: Diagnosis present

## 2018-03-07 DIAGNOSIS — I6523 Occlusion and stenosis of bilateral carotid arteries: Secondary | ICD-10-CM | POA: Diagnosis present

## 2018-03-07 DIAGNOSIS — R64 Cachexia: Secondary | ICD-10-CM | POA: Diagnosis present

## 2018-03-07 DIAGNOSIS — I700 Atherosclerosis of aorta: Secondary | ICD-10-CM | POA: Diagnosis present

## 2018-03-07 DIAGNOSIS — E039 Hypothyroidism, unspecified: Secondary | ICD-10-CM | POA: Diagnosis present

## 2018-03-07 DIAGNOSIS — Z79899 Other long term (current) drug therapy: Secondary | ICD-10-CM

## 2018-03-07 DIAGNOSIS — Z79890 Hormone replacement therapy (postmenopausal): Secondary | ICD-10-CM

## 2018-03-07 DIAGNOSIS — I10 Essential (primary) hypertension: Secondary | ICD-10-CM

## 2018-03-07 DIAGNOSIS — I252 Old myocardial infarction: Secondary | ICD-10-CM | POA: Diagnosis not present

## 2018-03-07 DIAGNOSIS — E785 Hyperlipidemia, unspecified: Secondary | ICD-10-CM | POA: Diagnosis present

## 2018-03-07 DIAGNOSIS — F0150 Vascular dementia without behavioral disturbance: Secondary | ICD-10-CM | POA: Diagnosis not present

## 2018-03-07 DIAGNOSIS — W19XXXA Unspecified fall, initial encounter: Secondary | ICD-10-CM | POA: Diagnosis not present

## 2018-03-07 DIAGNOSIS — R1310 Dysphagia, unspecified: Secondary | ICD-10-CM | POA: Diagnosis not present

## 2018-03-07 DIAGNOSIS — I951 Orthostatic hypotension: Secondary | ICD-10-CM | POA: Diagnosis present

## 2018-03-07 DIAGNOSIS — Z7189 Other specified counseling: Secondary | ICD-10-CM | POA: Diagnosis not present

## 2018-03-07 DIAGNOSIS — R451 Restlessness and agitation: Secondary | ICD-10-CM | POA: Diagnosis not present

## 2018-03-07 DIAGNOSIS — Z515 Encounter for palliative care: Secondary | ICD-10-CM | POA: Diagnosis not present

## 2018-03-07 DIAGNOSIS — N179 Acute kidney failure, unspecified: Secondary | ICD-10-CM

## 2018-03-07 DIAGNOSIS — Z66 Do not resuscitate: Secondary | ICD-10-CM | POA: Diagnosis not present

## 2018-03-07 DIAGNOSIS — I6521 Occlusion and stenosis of right carotid artery: Secondary | ICD-10-CM | POA: Diagnosis not present

## 2018-03-07 DIAGNOSIS — E876 Hypokalemia: Secondary | ICD-10-CM | POA: Diagnosis not present

## 2018-03-07 DIAGNOSIS — G9341 Metabolic encephalopathy: Secondary | ICD-10-CM | POA: Diagnosis present

## 2018-03-07 DIAGNOSIS — R4182 Altered mental status, unspecified: Secondary | ICD-10-CM | POA: Diagnosis not present

## 2018-03-07 DIAGNOSIS — Z681 Body mass index (BMI) 19 or less, adult: Secondary | ICD-10-CM | POA: Diagnosis not present

## 2018-03-07 DIAGNOSIS — G459 Transient cerebral ischemic attack, unspecified: Secondary | ICD-10-CM | POA: Diagnosis present

## 2018-03-07 DIAGNOSIS — I361 Nonrheumatic tricuspid (valve) insufficiency: Secondary | ICD-10-CM | POA: Diagnosis not present

## 2018-03-07 DIAGNOSIS — R404 Transient alteration of awareness: Secondary | ICD-10-CM | POA: Diagnosis not present

## 2018-03-07 DIAGNOSIS — F05 Delirium due to known physiological condition: Secondary | ICD-10-CM | POA: Diagnosis present

## 2018-03-07 DIAGNOSIS — R29710 NIHSS score 10: Secondary | ICD-10-CM | POA: Diagnosis present

## 2018-03-07 DIAGNOSIS — I2510 Atherosclerotic heart disease of native coronary artery without angina pectoris: Secondary | ICD-10-CM | POA: Diagnosis present

## 2018-03-07 DIAGNOSIS — R29810 Facial weakness: Secondary | ICD-10-CM | POA: Diagnosis present

## 2018-03-07 DIAGNOSIS — R296 Repeated falls: Secondary | ICD-10-CM | POA: Diagnosis present

## 2018-03-07 DIAGNOSIS — R4701 Aphasia: Secondary | ICD-10-CM | POA: Diagnosis present

## 2018-03-07 DIAGNOSIS — R410 Disorientation, unspecified: Secondary | ICD-10-CM | POA: Diagnosis not present

## 2018-03-07 DIAGNOSIS — R001 Bradycardia, unspecified: Secondary | ICD-10-CM | POA: Diagnosis not present

## 2018-03-07 DIAGNOSIS — Y92230 Patient room in hospital as the place of occurrence of the external cause: Secondary | ICD-10-CM | POA: Diagnosis not present

## 2018-03-07 LAB — URINE CULTURE: Culture: NO GROWTH

## 2018-03-07 LAB — BASIC METABOLIC PANEL
Anion gap: 8 (ref 5–15)
BUN: 18 mg/dL (ref 8–23)
CO2: 26 mmol/L (ref 22–32)
CREATININE: 1.11 mg/dL (ref 0.61–1.24)
Calcium: 9 mg/dL (ref 8.9–10.3)
Chloride: 105 mmol/L (ref 98–111)
GFR calc Af Amer: >60 mL/min (ref >60)
GFR, EST NON AFRICAN AMERICAN: 57 mL/min — AB (ref >60)
GLUCOSE: 94 mg/dL (ref 70–99)
Potassium: 3.7 mmol/L (ref 3.5–5.1)
Sodium: 139 mmol/L (ref 135–145)

## 2018-03-07 MED ORDER — SODIUM CHLORIDE 0.9 % IV BOLUS
500.0000 mL | Freq: Once | INTRAVENOUS | Status: AC
  Administered 2018-03-07: 500 mL via INTRAVENOUS

## 2018-03-07 NOTE — Progress Notes (Signed)
Internal Medicine Attending:   I saw and examined the patient. I reviewed the resident's note and I agree with the resident's findings and plan as documented in the resident's note.  Hospital day #2 after transient loss of consciousness likely due to orthostatic hypotension.  He is at risk for cerebral hypoperfusion given his significant atherosclerotic disease.  Greatly appreciate vascular surgery, I agree that there is probably limited benefit to stenting procedure.  We are working on his acute encephalopathy, continue with supportive care, avoid sedating medications, he is not on any antihypertensives.  Ideally we would do an MRI brain to rule out acute CVA, but I am not sure if he is going to tolerate it given his mental status.  I agree with treating with aspirin, not sure there is much benefit to statin given his very advanced age.  PT and OT already recommending skilled nursing facility, we will start to work on those options.

## 2018-03-07 NOTE — Progress Notes (Signed)
I came again to do the echo but the patient was in chair. Once back in the bed, he would not lay down.

## 2018-03-07 NOTE — Progress Notes (Signed)
Occupational Therapy Evaluation Patient Details Name: Ronald English MRN: [id number] DOB: [birth year] Today's Date: 03/07/2018    History of Present Illness This is a 84 year old male with a history of hypothyroidism, HLD, HTN, CVA (2015) and carotid stenosis presenting with altered mental status.    Clinical Impression   PTA, pt lived at home with wife and was independent with ADL and mobility. Pt unable to give any information duirng session due to apparent cognitive deficits and confusion. At this time, pt requires +2 Max A with limited mobility and ADL tasks. Recommend rehab at SNF. BP @ bed 129/43; BP sitting EOB 122/76. Attempted standing but unable due to pt becoming restless and unsafe. Will follow acutely to facilitate DC to next venue of care.     Follow Up Recommendations  SNF;Supervision/Assistance - 24 hour    Equipment Recommendations  3 in 1 bedside commode    Recommendations for Other Services       Precautions / Restrictions Precautions Precautions: Fall Restrictions Weight Bearing Restrictions: No      Mobility Bed Mobility Overal bed mobility: Needs Assistance Bed Mobility: Supine to Sit     Supine to sit: Mod assist     General bed mobility comments: affected by confusion/difficulty with maintaining attention to task and following commands  Transfers Overall transfer level: Needs assistance Equipment used: 2 person hand held assist Transfers: Sit to/from Stand;Stand Pivot Transfers Sit to Stand: +2 physical assistance;Max assist Stand pivot transfers: Max assist;+2 physical assistance            Balance Overall balance assessment: Needs assistance   Sitting balance-Leahy Scale: Fair       Standing balance-Leahy Scale: Poor                             ADL either performed or assessed with clinical judgement   ADL Overall ADL's : Needs assistance/impaired Eating/Feeding: Set up;Sitting   Grooming: Moderate  assistance;Sitting   Upper Body Bathing: Moderate assistance;Sitting   Lower Body Bathing: Maximal assistance;Sit to/from stand   Upper Body Dressing : Moderate assistance;Sitting   Lower Body Dressing: Maximal assistance;Sit to/from stand   Toilet Transfer: +2 for physical assistance;Maximal assistance   Toileting- Clothing Manipulation and Hygiene: Maximal assistance Toileting - Clothing Manipulation Details (indicate cue type and reason): foley     Functional mobility during ADLs: Maximal assistance;+2 for physical assistance       Vision Baseline Vision/History: Wears glasses Additional Comments: unable to assess at this time due to confuaion     Perception Perception Comments: deficits noted; feel pt is experiencing hallucinations   Praxis Praxis Praxis tested?: Deficits Deficits: Initiation;Organization    Pertinent Vitals/Pain Pain Assessment: Faces Faces Pain Scale: No hurt     Hand Dominance Right   Extremity/Trunk Assessment Upper Extremity Assessment Upper Extremity Assessment: Overall WFL for tasks assessed   Lower Extremity Assessment Lower Extremity Assessment: Defer to PT evaluation   Cervical / Trunk Assessment Cervical / Trunk Assessment: Other exceptions(forward head)   Communication Communication Communication: HOH   Cognition Arousal/Alertness: Awake/alert Behavior During Therapy: Restless;Agitated Overall Cognitive Status: Impaired/Different from baseline Area of Impairment: Orientation;Attention;Memory;Awareness;Safety/judgement;Following commands;Problem solving                 Orientation Level: Disoriented to;Place;Time;Situation Current Attention Level: Focused Memory: Decreased recall of precautions;Decreased short-term memory Following Commands: Follows one step commands inconsistently Safety/Judgement: Decreased awareness of safety;Decreased awareness of deficits Awareness: Intellectual Problem  Solving: Slow  processing;Decreased initiation;Difficulty sequencing;Requires verbal cues;Requires tactile cues     General Comments   Wife does not appear to understand significance of her husband's current confusion/cognitivie deficits. Daughter present for session.     Exercises     Shoulder Instructions      Home Living Family/patient expects to be discharged to:: Private residence   Available Help at Discharge: Family Type of Home: House Home Access: Stairs to enter Technical brewer of Steps: 1 Entrance Stairs-Rails: None Home Layout: One level     Bathroom Shower/Tub: Chief Strategy Officer: Shower seat          Prior Functioning/Environment Level of Independence: Independent        Comments: Community ambulatory         OT Problem List: Impaired balance (sitting and/or standing);Impaired vision/perception;Decreased coordination;Decreased cognition;Decreased safety awareness;Decreased knowledge of use of DME or AE      OT Treatment/Interventions: Self-care/ADL training;Therapeutic exercise;DME and/or AE instruction;Therapeutic activities;Cognitive remediation/compensation;Visual/perceptual remediation/compensation;Patient/family education;Balance training    OT Goals(Current goals can be found in the care plan section) Acute Rehab OT Goals Patient Stated Goal: per family for pt to get back to normal OT Goal Formulation: With family Time For Goal Achievement: 03/21/18 Potential to Achieve Goals: Good  OT Frequency: Min 2X/week   Barriers to D/C:            Co-evaluation PT/OT/SLP Co-Evaluation/Treatment: Yes Reason for Co-Treatment: Necessary to address cognition/behavior during functional activity;For patient/therapist safety;To address functional/ADL transfers   OT goals addressed during session: ADL's and self-care      AM-PAC PT "6 Clicks" Daily Activity     Outcome Measure Help from another person eating meals?: A Little Help from  another person taking care of personal grooming?: A Lot Help from another person toileting, which includes using toliet, bedpan, or urinal?: A Lot Help from another person bathing (including washing, rinsing, drying)?: A Lot Help from another person to put on and taking off regular upper body clothing?: A Lot Help from another person to put on and taking off regular lower body clothing?: A Lot 6 Click Score: 13   End of Session Equipment Utilized During Treatment: Gait belt Nurse Communication: Mobility status;Other (comment)(pt will need safety sitter if family not in room)  Activity Tolerance: Treatment limited secondary to agitation Patient left: in chair;with call bell/phone within reach;with chair alarm set;with family/visitor present  OT Visit Diagnosis: Other abnormalities of gait and mobility (R26.89);Other symptoms and signs involving cognitive function                Time: 9675-9163 OT Time Calculation (min): 37 min Charges:  OT General Charges $OT Visit: 1 Visit OT Evaluation $OT Eval Moderate Complexity: [place], OT/L  OT Clinical Specialist [phone]   [facility] 03/07/2018, 12:03 PM

## 2018-03-07 NOTE — Progress Notes (Signed)
  Progress Note    03/07/2018 11:54 AM * No surgery found *  Subjective: Still appears confused  Vitals:   03/06/18 2024 03/07/18 0523  BP: 136/73 (!) 133/55  Pulse: (!) 51 (!) 59  Resp: 18 18  Temp: 98.6 F (37 C) 97.7 F (36.5 C)  SpO2: 99% 99%    Physical Exam: Confused but able to answer questions Working with physical therapy at time of exam.  CBC    Component Value Date/Time   WBC 11.6 (H) 03/06/2018 1114   RBC 4.08 (L) 03/06/2018 1114   HGB 12.2 (L) 03/06/2018 1132   HCT 36.0 (L) 03/06/2018 1132   PLT 176 03/06/2018 1114   MCV 93.4 03/06/2018 1114   MCH 30.1 03/06/2018 1114   MCHC 32.3 03/06/2018 1114   RDW 12.7 03/06/2018 1114   LYMPHSABS 2.9 03/06/2018 1114   MONOABS 0.7 03/06/2018 1114   EOSABS 2.3 (H) 03/06/2018 1114   BASOSABS 0.1 03/06/2018 1114    BMET    Component Value Date/Time   NA 139 03/07/2018 0543   K 3.7 03/07/2018 0543   CL 105 03/07/2018 0543   CO2 26 03/07/2018 0543   GLUCOSE 94 03/07/2018 0543   BUN 18 03/07/2018 0543   CREATININE 1.11 03/07/2018 0543   CALCIUM 9.0 03/07/2018 0543   GFRNONAA 57 (L) 03/07/2018 0543   GFRAA >60 03/07/2018 0543    INR    Component Value Date/Time   INR 1.11 03/06/2018 1114     Intake/Output Summary (Last 24 hours) at 03/07/2018 1154 Last data filed at 03/07/2018 1007 Gross per 24 hour  Intake 2089.35 ml  Output 800 ml  Net 1289.35 ml     Assessment:  84 y.o. male is here with confusion considered less likely to be stroke most likely encephalopathy.  He had 80% stenosis by CT angios velocities only demonstrate 40 to 59% stenosis.  Given his underlying comorbidities I would not want to proceed with surgical intervention at this time.  Plan: No vascular intervention at this time Will f/u in 6 months with carotid duplex Please call if issues arise, will not actively follow.  Zayne Draheim C. Donzetta Matters, MD [facility] Office: [phone] Pager:  [phone]  03/07/2018 11:54 AM

## 2018-03-07 NOTE — Progress Notes (Signed)
The patient came down to the heart and vascular lab for carotid and echo. I was unable to complete the echo because the patient became confused, agitated, and wanted to get up and urinate in spite of condom cath.

## 2018-03-07 NOTE — Progress Notes (Addendum)
Went to discuss the plan with the family. Answered all the questions that they had. They reported that he has been having a lot of agitation and will get up and try and walk around. He is very unsteady, he got up a few times when I was in the room and appeared to be very unsteady and seemed like he would fall if there was no one there. We had ordered a sitter but this was not available at this time. I do not want to give any sedating medications which could worsen his confusion. Will give soft restraints for now to attempt to prevent him from causing harm to himself.   Addendum: Patient had a fall prior to having the restraints placed. The wife was present when it happened and reported that he was between the bed and the chair and fell backward on his buttocks. He denies any pain at the moment. He does have a small lesion on his back, no tenderness to palpation on back, hips or spinal area. He had a soft belt restriant on when I saw him. Will continue this for now due to high risk of fall.

## 2018-03-07 NOTE — Progress Notes (Signed)
Telemetry notified staff that the patients HR is ~40bpm. Patient remains asymptomatic during these events. He continues to sleep, with preserved respiratory rate and BP. Rate has been low throughout the day. Patient is hypothyroid with TSH 12.66, suspect medication nonadherent vs insufficient T4/T3 in addition to age and resting status.   Advised to continue monitoring patient at this time and to notify MD if patient becomes symptomatic with tachypnea, hypotension, chest pain, decreased mental alertness from current baseline, or somnolence. Okay to decrease telemetry alarm to 40bmp as the low at this time.  Kathi Ludwig, MD PGY-2

## 2018-03-07 NOTE — Evaluation (Signed)
Physical Therapy Evaluation Patient Details Name: Ronald English MRN: [id number] DOB: 1925/12/17 Today's Date: 03/07/2018   History of Present Illness  This is a 84 year old male with a history of hypothyroidism, HLD, HTN, CVA (2015) and carotid stenosis presenting with altered mental status.   Clinical Impression  Prior to admission, patient independent with ADL's and mobility. Patient restless throughout evaluation and confused; became somewhat combative with therapy towards end of session, but calmed down once stimulus removed. Currently requiring two person maximal assistance for transfer from bed to chair via handheld assist.  Pt wife seems to have somewhat limited insight/understanding into patient's current level of assist and deficits, initially stating she wanted to take him home (although, she is frail appearing and uses a walker herself). Extensively discussed recommendation for SNF at discharge to maximize functional independence and decrease caregiver burden. Will follow acutely.    Follow Up Recommendations SNF;Supervision/Assistance - 24 hour    Equipment Recommendations  Other (comment)(defer to next venue)    Recommendations for Other Services       Precautions / Restrictions Precautions Precautions: Fall Restrictions Weight Bearing Restrictions: No      Mobility  Bed Mobility Overal bed mobility: Needs Assistance Bed Mobility: Supine to Sit     Supine to sit: Mod assist     General bed mobility comments: affected by confusion/difficulty with maintaining attention to task and following commands  Transfers Overall transfer level: Needs assistance Equipment used: 2 person hand held assist Transfers: Sit to/from Stand;Stand Pivot Transfers Sit to Stand: +2 physical assistance;Max assist Stand pivot transfers: Max assist;+2 physical assistance       General transfer comment: affected by confusion/difficulty with task. Able to achieve on second attempt with  encouragement from son standing in front of him to engage his attention. Difficulty achieving forward momentum  Ambulation/Gait             General Gait Details: deferred  Stairs            Wheelchair Mobility    Modified Rankin (Stroke Patients Only) Modified Rankin (Stroke Patients Only) Pre-Morbid Rankin Score: Slight disability Modified Rankin: Severe disability     Balance Overall balance assessment: Needs assistance   Sitting balance-Leahy Scale: Fair       Standing balance-Leahy Scale: Poor                               Pertinent Vitals/Pain Pain Assessment: Faces Faces Pain Scale: No hurt    Home Living Family/patient expects to be discharged to:: Private residence Living Arrangements: Spouse/significant other Available Help at Discharge: Family Type of Home: House Home Access: Stairs to enter Entrance Stairs-Rails: None Technical brewer of Steps: 1 Home Layout: One level Home Equipment: Shower seat((pt wife has walker and cane))      Prior Function Level of Independence: Independent         Comments: Community ambulatory      Hand Dominance   Dominant Hand: Right    Extremity/Trunk Assessment   Upper Extremity Assessment Upper Extremity Assessment: Overall WFL for tasks assessed    Lower Extremity Assessment Lower Extremity Assessment: Overall WFL for tasks assessed    Cervical / Trunk Assessment Cervical / Trunk Assessment: Other exceptions(forward head)  Communication   Communication: HOH  Cognition Arousal/Alertness: Awake/alert Behavior During Therapy: Restless;Agitated Overall Cognitive Status: Impaired/Different from baseline Area of Impairment: Orientation;Attention;Memory;Awareness;Safety/judgement;Following commands;Problem solving  Orientation Level: Disoriented to;Place;Time;Situation Current Attention Level: Focused Memory: Decreased recall of precautions;Decreased  short-term memory Following Commands: Follows one step commands inconsistently Safety/Judgement: Decreased awareness of safety;Decreased awareness of deficits Awareness: Intellectual Problem Solving: Slow processing;Decreased initiation;Difficulty sequencing;Requires verbal cues;Requires tactile cues General Comments:        General Comments General comments (skin integrity, edema, etc.): pt wife, son and daughter in law present    Exercises     Assessment/Plan    PT Assessment Patient needs continued PT services  PT Problem List Decreased activity tolerance;Decreased balance;Decreased mobility;Decreased cognition;Decreased safety awareness       PT Treatment Interventions Gait training;DME instruction;Functional mobility training;Therapeutic activities;Therapeutic exercise;Balance training;Patient/family education    PT Goals (Current goals can be found in the Care Plan section)  Acute Rehab PT Goals Patient Stated Goal: per family for pt to get back to normal PT Goal Formulation: With patient/family Time For Goal Achievement: 03/21/18 Potential to Achieve Goals: Good    Frequency Min 2X/week   Barriers to discharge Other (comment)(caregiver unable to provide physical assist)      Co-evaluation PT/OT/SLP Co-Evaluation/Treatment: Yes Reason for Co-Treatment: Necessary to address cognition/behavior during functional activity;For patient/therapist safety;To address functional/ADL transfers PT goals addressed during session: Mobility/safety with mobility OT goals addressed during session: ADL's and self-care       AM-PAC PT "6 Clicks" Daily Activity  Outcome Measure Difficulty turning over in bed (including adjusting bedclothes, sheets and blankets)?: Unable Difficulty moving from lying on back to sitting on the side of the bed? : Unable Difficulty sitting down on and standing up from a chair with arms (e.g., wheelchair, bedside commode, etc,.)?: Unable Help needed  moving to and from a bed to chair (including a wheelchair)?: A Lot Help needed walking in hospital room?: Total Help needed climbing 3-5 steps with a railing? : Total 6 Click Score: 7    End of Session Equipment Utilized During Treatment: Gait belt Activity Tolerance: Treatment limited secondary to agitation Patient left: in chair;with call bell/phone within reach;with chair alarm set;with family/visitor present Nurse Communication: Mobility status PT Visit Diagnosis: Unsteadiness on feet (R26.81);Other abnormalities of gait and mobility (R26.89);Difficulty in walking, not elsewhere classified (R26.2)    Time: 8032-1224 PT Time Calculation (min) (ACUTE ONLY): 43 min   Charges:   PT Evaluation $PT Eval Moderate Complexity: 1 Mod PT Treatments $Therapeutic Activity: 8-22 mins        Ellamae Sia, PT, DPT Acute Rehabilitation Services  Pager: [phone] 03/07/2018, 1:34 PM

## 2018-03-07 NOTE — Progress Notes (Signed)
   03/07/18 1524  What Happened  Was fall witnessed? Yes  Who witnessed fall? Dhanvin Szeto (wife)  Patients activity before fall to/from bed, chair, or stretcher  Point of contact back  Was patient injured? No  Follow Up  MD notified Dr. Sherry Ruffing  Time MD notified 1535  Family notified Yes-comment (Wife, daughter, son-in-law at bedside)  Time family notified 1530  Additional tests No  Simple treatment Dressing  Progress note created (see row info) Yes  Adult Fall Risk Assessment  Risk Factor Category (scoring not indicated) Fall has occurred during this admission (document High fall risk)  Patient's Fall Risk High Fall Risk (>13 points)  Adult Fall Risk Interventions  Required Bundle Interventions *See Row Information* High fall risk - low, moderate, and high requirements implemented  Additional Interventions Family Supervision  Screening for Fall Injury Risk (To be completed on HIGH fall risk patients) - Assessing Need for Low Bed  Risk For Fall Injury- Low Bed Criteria 85 years or older  Will Implement Low Bed and Floor Mats Low bed contraindicated, floor mats in place  Specialty Low Bed Contraindicated Hemodynamically unstable  Screening for Fall Injury Risk (To be completed on HIGH fall risk patients who do not meet crieteria for Low Bed) - Assessing Need for Floor Mats Only  Risk For Fall Injury- Criteria for Floor Mats Confusion/dementia (+CAM, CIWA, TBI, etc.)  Will Implement Floor Mats Yes

## 2018-03-07 NOTE — Progress Notes (Signed)
I came to the room to do the echo but the echo physical therapy was there.

## 2018-03-07 NOTE — Progress Notes (Signed)
   Subjective: Patient was doing well. He did have some bradycardia overnight, telemetry showed sinus bradycardia and had normal RR and blood pressure. He was not having any pain today. He was eating when we saw him and appeared very comfortable. He still appears confused and would only answer questions some times. The family reports that he is similar to when he came in.  We discussed with the family the plan for today and they are in agreement.   Objective:  Vital signs in last 24 hours: Vitals:   03/06/18 1900 03/06/18 1915 03/06/18 2024 03/07/18 0523  BP: (!) 140/54 (!) 147/52 136/73 (!) 133/55  Pulse: (!) 46 (!) 53 (!) 51 (!) 59  Resp: 17 (!) 21 18 18   Temp:   98.6 F (37 C) 97.7 F (36.5 C)  TempSrc:   Oral Oral  SpO2: 97% (!) 88% 99% 99%  Weight:      Height:        General: Resting comfortably in bed, NAD, eating by himself HEENT: Normocephalic, nontraumatic, midline trachea Cardiac: RRR, normal S1, S2 Pulmonary: Lungs CTA bilaterally, no wheezing Extremity: No LE edema, no muscle atrophy Neuro: Alert, oriented only to person, 4/5 strength, moves all extremities Psychiatry: Normal mood and affect    Assessment/Plan: This is a 84 year old male with a history of hypothyroidism, HLD, HTN, carotid stenosis presenting with altered mental status.  Transient loss of consciousness:  Patient initially presented with a sudden onset of a headache, generalized weakness, and a short episode of tremor, the wife reports that he may have lost consciousness because he was not responding. He does have a mild left sided facial droop, and 4/5 strength today. He has significant atheroscleotic disease, 80% in the right ICA, 65% in the left ICA. There is a concern about cerebral hypoperfusion due to his atherosclerosis and transient hypotension. -EEG was done which showed mild generalized cerebral dysfunction, no epileptiform features were not seen.  -Vascular ultrasound showed 40-59% right  internal carotid artery stenosis, and 1-39% left internal carotid artery stenosis.  -Cholesterol 169, HDL 35, LDL 119 -B12 704 -A1C 5.4 -F/u echocardiogram -Vascular surgery evaluated, they do not recommend vascular intervention at this time, will f/u in 6 months -OT and PT evaluated: they recommend SNF placement on discharge -MRI today  Hypothyroidism:  -Patient has been using Amour for his thyroid medication, the wife reported that he takes 2 pills on the weekends and 1 pill on the weekday but does not know the dosing. She states that she often has to remind him to take them but she doesn't always do that, and that he sometimes will take it by himself.  -TSH here was 12.66, significantly elevated, it's possible that he has missed more doses then they realize or that he is not on his optimal regiman -Continue amour 30 mg PO for now  AKI: BUN 24, Cr 1.34 on admission, elevated from the last BUN 15 and Cr 0.85 in 2015 -[place] cath on -On NS 90cc/hr maintenance fluids -Today his BUN 18, Cr 1.11  FEN: Ns 90cc/hr, replete lytes prn, regular diet  VTE ppx: Lovenox  Code Status: FULL    Dispo: Anticipated discharge in approximately 1-2 day(s).   Asencion Noble, MD 03/07/2018, 6:29 AM Pager: [phone]

## 2018-03-07 NOTE — Progress Notes (Signed)
Around 1530, heard chair alarm going off and someone yelling "help." Patient had a fall witnessed by his wife and was found sitting on his bottom on the floor between this recliner and bed. Patient was assisted back into the bed. Denies hitting his head and wife states that she does not think he hit his head either. Small abrasion noted on mid-back, foam in place. BP elevated at 153/92 but otherwise stable. Prior to fall, 1:1 sitter was requested but was unable to have one sent. Patient was very agitated and difficult to redirect. MD aware. Patient's wife, daughter, and son-in-law at bedside. Will continue to monitor.

## 2018-03-07 NOTE — Progress Notes (Signed)
Paged for possible agitation, patient getting out of bed not clearly responding to questions. Upon visiting patient at bedside he was noted to be lying calmly in a supine position in no acute distress drifting into sleep. This is a less than atypical occurrence and as such we advised the nursing staff to please continue gentle redirection but to notify us if the rising from the bed and pulling at his catheter continues. At that point we would consider a safety sitter as there is not indication for pharmacologic or mechanical restraints at this time.    Thank you  Kathi Ludwig, MD PGY-2

## 2018-03-07 NOTE — Progress Notes (Signed)
*  PRELIMINARY RESULTS* Vascular Ultrasound Carotid Duplex (Doppler) has been completed. Findings suggest 40-59% right internal carotid artery stenosis based on 2D visualization and systolic velocities, and 5-52% left internal carotid artery stenosis. Vertebral arteries are patent with antegrade flow.  03/07/2018 9:24 AM Maudry Mayhew, MHA, RVT, RDCS, RDMS

## 2018-03-07 NOTE — Progress Notes (Signed)
Patient's heart rate was  < 50 asymptomatic notified on call MD. Will continue monitor the patient and notified as needed.

## 2018-03-08 ENCOUNTER — Inpatient Hospital Stay (HOSPITAL_COMMUNITY): Payer: Medicare Other

## 2018-03-08 DIAGNOSIS — G9389 Other specified disorders of brain: Secondary | ICD-10-CM

## 2018-03-08 DIAGNOSIS — R410 Disorientation, unspecified: Secondary | ICD-10-CM

## 2018-03-08 DIAGNOSIS — I951 Orthostatic hypotension: Secondary | ICD-10-CM

## 2018-03-08 DIAGNOSIS — R451 Restlessness and agitation: Secondary | ICD-10-CM

## 2018-03-08 DIAGNOSIS — I361 Nonrheumatic tricuspid (valve) insufficiency: Secondary | ICD-10-CM

## 2018-03-08 DIAGNOSIS — Z9181 History of falling: Secondary | ICD-10-CM

## 2018-03-08 LAB — T4, FREE: FREE T4: 0.62 ng/dL — AB (ref 0.82–1.77)

## 2018-03-08 LAB — ECHOCARDIOGRAM COMPLETE
Height: 68 in
WEIGHTICAEL: 1696.66 [oz_av]

## 2018-03-08 MED ORDER — RISPERIDONE 0.5 MG PO TBDP
0.5000 mg | ORAL_TABLET | Freq: Every day | ORAL | Status: DC
  Administered 2018-03-08: 0.5 mg via ORAL
  Filled 2018-03-08: qty 1

## 2018-03-08 MED ORDER — NICOTINE 7 MG/24HR TD PT24
7.0000 mg | MEDICATED_PATCH | Freq: Every day | TRANSDERMAL | Status: DC
  Administered 2018-03-08 – 2018-03-14 (×7): 7 mg via TRANSDERMAL
  Filled 2018-03-08 (×7): qty 1

## 2018-03-08 MED ORDER — RISPERIDONE 0.5 MG PO TBDP
0.2500 mg | ORAL_TABLET | Freq: Every day | ORAL | Status: DC
  Administered 2018-03-08: 0.25 mg via ORAL
  Filled 2018-03-08 (×2): qty 0.5

## 2018-03-08 NOTE — Progress Notes (Signed)
Internal Medicine Attending:   I saw and examined the patient. I reviewed the resident's note and I agree with the resident's findings and plan as documented in the resident's note.  Hospital day #3 with transient loss of consciousness due to cerebral hypoperfusion, orthostatic hypotension, carotid atherosclerosis and advanced age. Hospital stay complicated by acute encephalopathy, his grandson is at the bedside and tells [place] that Mr. Pursell has struggled with hospital delirium consistently with each hospital stay. Level of disorientation does put him at safety risk, he had a fall out of bed last night even with his wife in the room. No other reversible cause right now, no signs of infection, no centrally acting medications. I agree with plan to start low dose antipsychotic agent, try busy vest, dc telemetry, out of bed to the chair for the day, he wears glasses but not hearing aides, try to remove mitten restraints if able. Once we have the delirium under better control we can work on transfer to SNF for rehab. I broached the subject of palliative care with grandson, he understands that the patient has limited prognosis due to fragility and advanced age, but defers that discussion to the patient's wife.

## 2018-03-08 NOTE — Progress Notes (Signed)
  Echocardiogram 2D Echocardiogram has been performed.  Ronald English M 03/08/2018, 1:53 PM

## 2018-03-08 NOTE — NC FL2 (Signed)
  [place] LEVEL OF CARE SCREENING TOOL     IDENTIFICATION  Patient Name: Ronald English Birthdate: 1925-12-22 Sex: male Admission Date (Current Location): 03/06/2018  [facility] and [place] Number:  Herbalist and Address:  [facility] [place], [place] [address], [place], [place] [address]      Provider Number: [id number]  Attending Physician Name and Address:  Axel Filler, *  Relative Name and Phone Number:  Phineas Semen, spouse, [phone]    Current Level of Care: Hospital Recommended Level of Care: [place] Prior Approval Number:    Date Approved/Denied:   PASRR Number: [id number]  Discharge Plan: SNF    Current Diagnoses: Patient Active Problem List   Diagnosis Date Noted  . TIA (transient ischemic attack) 03/07/2018  . Aortic atherosclerosis ([place]) 03/06/2018  . Altered mental status 03/06/2018  . CVA (cerebral infarction) 03/01/2014  . Encephalopathy 02/28/2014  . H/O class II angina pectoris 11/16/2012  . CAD (coronary artery disease)   . H/O: hypothyroidism   . Carotid stenosis 01/18/2011  . Anteroseptal myocardial infarction ([place])   . Hypercholesterolemia     Orientation RESPIRATION BLADDER Height & Weight     Self, Place  Normal Incontinent, External catheter Weight: 48.1 kg Height:  5\' 8"  (172.7 cm)  BEHAVIORAL SYMPTOMS/MOOD NEUROLOGICAL BOWEL NUTRITION STATUS  Other (Comment)(Dementia)   Continent Diet(Please see DC Summary)  AMBULATORY STATUS COMMUNICATION OF NEEDS Skin   Extensive Assist Verbally Normal                       Personal Care Assistance Level of Assistance  Bathing, Feeding, Dressing Bathing Assistance: Maximum assistance Feeding assistance: Limited assistance Dressing Assistance: Maximum assistance     Functional Limitations Info  Sight, Hearing, Speech Sight Info: Adequate Hearing Info: Adequate Speech Info: Adequate    SPECIAL CARE FACTORS FREQUENCY  PT  (By licensed PT), OT (By licensed OT)     PT Frequency: 5x/week OT Frequency: 3x/week            Contractures Contractures Info: Not present    Additional Factors Info  Code Status, Allergies Code Status Info: Full Allergies Info: Codeine           Current Medications (03/08/2018):  This is the current hospital active medication list Current Facility-Administered Medications  Medication Dose Route Frequency Provider Last Rate Last Dose  . 0.9 %  sodium chloride infusion   Intravenous Continuous Ledell Noss, MD 90 mL/hr at 03/07/18 2228    . aspirin chewable tablet 81 mg  81 mg Oral Daily Asencion Noble, MD   81 mg at 03/08/18 3662  . atorvastatin (LIPITOR) tablet 40 mg  40 mg Oral q1800 Ledell Noss, MD      . enoxaparin (LOVENOX) injection 30 mg  30 mg Subcutaneous Q24H Ledell Noss, MD   30 mg at 03/06/18 2303  . thyroid (ARMOUR) tablet 30 mg  30 mg Oral QAC breakfast Ledell Noss, MD   30 mg at 03/08/18 0830     Discharge Medications: Please see discharge summary for a list of discharge medications.  Relevant Imaging Results:  Relevant Lab Results:   Additional Information SSN: [place], [place]

## 2018-03-08 NOTE — Clinical Social Work Note (Signed)
Clinical Social Work Assessment  Patient Details  Name: Ronald English MRN: [id number] Date of Birth: 08-Sep-1925  Date of referral:  03/08/18               Reason for consult:  Facility Placement                Permission sought to share information with:  Facility Sport and exercise psychologist, Family Supports Permission granted to share information::  No  Name::     International aid/development worker::  SNFs  Relationship::  Spouse  Contact Information:  [phone]  Housing/Transportation Living arrangements for the past 2 months:  Single Family Home Source of Information:  Spouse, Adult Children Patient Interpreter Needed:  None Criminal Activity/Legal Involvement Pertinent to Current Situation/Hospitalization:  No - Comment as needed Significant Relationships:  Adult Children, Spouse Lives with:  Spouse Do you feel safe going back to the place where you live?  No Need for family participation in patient care:  Yes (Comment)  Care giving concerns:  CSW received consult for possible SNF placement at time of discharge. CSW spoke with patient's spouse, son, and daughter regarding PT recommendation of SNF placement at time of discharge. Patient's spouse reported that patient's spouse is currently unable to care for patient at their home given patient's current physical needs and fall risk. Patient's spouse expressed understanding of PT recommendation and is agreeable to SNF placement at time of discharge. CSW to continue to follow and assist with discharge planning needs.   Social Worker assessment / plan:  CSW spoke with patient's spouse concerning possibility of rehab at [facility] before returning home.  Employment status:  Retired Nurse, adult PT Recommendations:  [place] / Referral to community resources:  [place]  Patient/Family's Response to care:  Patient's spouse recognizes need for rehab before returning home and is agreeable to a  SNF in [place]. Patient's spouse wanted to make sure that patient would not be going for long term, which CSW confirmed. CSW explained insurance authorization process and provided family with SNF list.   Patient/Family's Understanding of and Emotional Response to Diagnosis, Current Treatment, and Prognosis:  Patient/family is realistic regarding therapy needs and expressed being hopeful for SNF placement so patient can return home. They have been married for many years. Patient's spouse expressed understanding of CSW role and discharge process as well as medical condition. No questions/concerns about plan or treatment.    Emotional Assessment Appearance:  Appears stated age Attitude/Demeanor/Rapport:  Unable to Assess Affect (typically observed):  Unable to Assess Orientation:  Oriented to Self, Oriented to Place Alcohol / Substance use:  Not Applicable Psych involvement (Current and /or in the community):  No (Comment)  Discharge Needs  Concerns to be addressed:  Care Coordination Readmission within the last 30 days:  No Current discharge risk:  Cognitively Impaired Barriers to Discharge:  Continued Medical Work up   [employer], [place] 03/08/2018, 8:45 AM

## 2018-03-08 NOTE — Progress Notes (Signed)
Subjective: Patient was doing okay today, denied any pain. He was accompanied by his grandson. He did have a fall yesterday evening on his buttocks when he was trying to stand up to walk around. We had ordered a sitter but they were unable to obtain one. He was placed in a soft belt restraint due to risk of harming himself. He denied any pain today, and there was no pain to palpation on his hips or back. We discussed with the grandson that we would start him on risperidone to help with his agitation. We also discussed that we are still working with social work to find a SNF. They are in agreement with the plan.  Objective:  Vital signs in last 24 hours: Vitals:   03/06/18 2024 03/07/18 0523 03/07/18 1535 03/07/18 2201  BP: 136/73 (!) 133/55 (!) 153/92 (!) 152/89  Pulse: (!) 51 (!) 59 62 65  Resp: 18 18 20 20   Temp: 98.6 F (37 C) 97.7 F (36.5 C) 98 F (36.7 C) 98 F (36.7 C)  TempSrc: Oral Oral Oral Oral  SpO2: 99% 99% 93% 94%  Weight:      Height:        General: Frail appearing male, no acute distress Cardiac: RRR, normal S1, S2, no murmurs, rubs or gallops Pulmonary: Lungs CTA bilaterally, no wheezing, rhonchi or rales  Extremity: No LE edema, no muscle atrophy Neuro: Alert, follows commands sometimes, confused speech, Psychiatry: Pleasant mood, normal affect  Assessment/Plan: This is a 84 year old male with a history of hypothyroidism, HLD, HTN, carotid stenosispresenting with altered mental status and transient loss of consciousness.  Transient loss of consciousness: Patient was going to get an MRI done yesterday however he was too agitated and would not lay flat for it to be completed. He is still confused today. Does not report any pain or headaches today. His lipids, A1c, B12 were all normal. He did have atherosclerosis of his left and right ICAs, vascular evaluated and they did not recommend stent placement at this time. There is a concern about cerebral hypoperfusion  due to his atherosclerosis and transient hypotension. He did have positive orthostatics yesterday, and he is on maintenance fluids.  -OT/PT evaluated and recommended SNF placement -Can attempt an MRI tomorrow if risperidone helps with his agitation  -Continue maintenance fluids -Continue ASA 81 mg -CSW to assist with SNF placement  Delirium: Patient has been very agitated and confused while in the hospital. He continues to try and stand up and is very imbalanced when he does. He did have a fall yesterday when he was between the bed and the chair, he suffered a small lesion on his back that had a bandage placed on it. He did not have any pain today and no tenderness to palpation so no imaging tests were ordered at this time. There is a safety concern that he could have another fall and hurt himself so soft restraints were ordered, he had a belt in place when we saw him. He has had delirium while in the hospital before. This is likely due to hospital environment, it can also be from his untreated hypothyroidism. We will assess for other causes.  -Rispiradone 0.25 in AM and 0.5 in PM  -Discontinue restraints when patient is calmer -CSW assisting with SNF   Hypothyroidism: Patient was found to have elevated TSH on admission.  -T4 0.62 today -Continue Armour 20mg  PO  AKI: BUN24, Cr 1.34 on admission, elevated from the last BUN 15 and  Cr 0.85 in 2015. He has been on maintenance fluids and his labs yesterday showed BUN 18 and Cr 1.11.  FEN: NS 90cc/hr, replete lytes prn, regular diet  VTE ppx: Lovenox  Code Status: FULL   Dispo: Anticipated discharge in approximately 1-2 day(s).   Asencion Noble, MD 03/08/2018, 6:25 AM Pager: [phone].

## 2018-03-09 ENCOUNTER — Other Ambulatory Visit: Payer: Self-pay

## 2018-03-09 DIAGNOSIS — I6523 Occlusion and stenosis of bilateral carotid arteries: Secondary | ICD-10-CM

## 2018-03-09 DIAGNOSIS — Z515 Encounter for palliative care: Secondary | ICD-10-CM

## 2018-03-09 DIAGNOSIS — R4182 Altered mental status, unspecified: Secondary | ICD-10-CM

## 2018-03-09 DIAGNOSIS — Z7189 Other specified counseling: Secondary | ICD-10-CM

## 2018-03-09 DIAGNOSIS — G9341 Metabolic encephalopathy: Principal | ICD-10-CM

## 2018-03-09 MED ORDER — RISPERIDONE 1 MG PO TBDP
1.0000 mg | ORAL_TABLET | Freq: Every day | ORAL | Status: DC
  Administered 2018-03-09 – 2018-03-13 (×4): 1 mg via ORAL
  Filled 2018-03-09 (×6): qty 1

## 2018-03-09 MED ORDER — RISPERIDONE 0.5 MG PO TBDP
0.5000 mg | ORAL_TABLET | Freq: Every day | ORAL | Status: DC
  Administered 2018-03-10 – 2018-03-14 (×5): 0.5 mg via ORAL
  Filled 2018-03-09 (×5): qty 1

## 2018-03-09 MED ORDER — RISPERIDONE 0.5 MG PO TABS
0.2500 mg | ORAL_TABLET | Freq: Every day | ORAL | Status: DC
  Administered 2018-03-09: 0.25 mg via ORAL
  Filled 2018-03-09: qty 1

## 2018-03-09 MED ORDER — RISPERIDONE 0.5 MG PO TBDP
0.2500 mg | ORAL_TABLET | Freq: Once | ORAL | Status: AC
  Administered 2018-03-09: 0.25 mg via ORAL
  Filled 2018-03-09: qty 0.5

## 2018-03-09 MED ORDER — RISPERIDONE 0.5 MG PO TBDP: 0.5000 mg | ORAL_TABLET | Freq: Two times a day (BID) | ORAL | Status: DC

## 2018-03-09 NOTE — Progress Notes (Signed)
Was paged that safety sitter was not available and that he was trying to get out of bed. Went to evaluate patient and he was resting comfortably when I saw him. Patient is in low bed, has all side walls brought up, mats on the floor around bed with bed alarm on. I talked with him for a bit and he was moving around in the bed but did not attempt to get out of bed when I was there. He has been having episodes of delirium and yesterday we started risperidone 0.25mg  in the morning and 0.5 in the evening. He did suffer a fall 2 days ago and had a soft belt restraint placed at that time, he had this removed yesterday. I do not believe it's necessary to restart this at this time since he appeared calm and we increased his risperidone dose for this evening. His risperidone was increased to 0.5mg  in the AM and 1 mg in the PM. Hopefully this will keep the patient calm and that he won't continue to try and get out of bed and harm himself. If he continues to be agitated will need to consider restarting the soft belt restraint. Can do a tele-sitter for now.

## 2018-03-09 NOTE — Progress Notes (Signed)
Patient alert but disoriented; after safety sitter left he had many attempts to get out of bed with no help. MD made alert and asked if the patient can benefit from a posey belt for his safety.  At this time patient is in bed (low bed) with matts on the floor, bed alarm on; phone and call bell in [place]; patient was toileted and he has condom cath. Will continue to monitor.

## 2018-03-09 NOTE — Consult Note (Signed)
Consultation Note Date: 03/09/2018   Patient Name: Ronald English  DOB: 20-Jan-1926  MRN: [id number]  Age / Sex: 84 y.o., male  PCP: Levin Erp, MD Referring Physician: Axel Filler, *  Reason for Consultation: Establishing goals of care  HPI/Patient Profile: 84 y.o. male  with past medical history of hypothyroidism hyperlipidemia hypertension CVA in 2015 and carotid stenosis admitted on 03/06/2018 with altered mental status.  Per his wife he was sitting at the kitchen table and his eyes rolled back in his head and he slumped forward.  EMS was called and a code stroke was initiated. CT/CTA scans showed small infarcts of undetermined age, and progressing carotid disease with right ICA stenosis increased to 80% from 50% in 2015.  AMS contributed to hypoperfusion.  Vascular surgery consulted without recommendations for intervention at this time.  During admission patient has remained confused and agitated at times.  He has also had a few falls.  Palliative medicine consulted for goals of care.  Clinical Assessment and Goals of Care: I evaluated the patient at bedside.  He was looking towards the ceiling and talking.  He does not answer my questions or follow commands. He does look me in the eyes briefly, but otherwise does not respond or acknowledge my presence. His family notes that he has been telling stories they think from his days in the [employer]. Chart review shows he has been working with physical therapy but that work has been limited due to his slow processing and frequent agitation. He appears cachectic and he is only eating about 5 to 10% of his meals. Patient's wife, daughter, and son-in-law are at bedside.  Also spoke with patient's son on the phone. Patient's daughter is asking to discuss Hospice options, but wishes to wait until her brother can be present. Patient's daughter tells me that  patient's wife is very fragile mentally, with some short term memory loss, and hearing problems, and not in a good place to make decisions regarding patient's care.  They would like to meet separately with palliative medicine tomorrow.  Meeting scheduled for 2 PM tomorrow afternoon.  Primary Decision Maker NEXT OF KIN- patient's children    SUMMARY OF RECOMMENDATIONS -PMT GOC meeting scheduled for 2pm 8/23 -I will order an SLP evaluation for prognostication as patient is at high risk for aspiration due to his altered mental status  Code Status/Advance Care Planning:  Full code  Palliative Prophylaxis:   Delirium Protocol and Frequent Pain Assessment  Additional Recommendations (Limitations, Scope, Preferences):  Full Scope Treatment  Prognosis:    Unable to determine  Discharge Planning: To Be Determined  Primary Diagnoses: Present on Admission: . Altered mental status . TIA (transient ischemic attack)   I have reviewed the medical record, interviewed the patient and family, and examined the patient. The following aspects are pertinent.  Past Medical History:  Diagnosis Date  . Anteroseptal myocardial infarction [facility]) 1993   2006 left main normal, right coronary artery patent stent, circumflex 30% marginal stenosis, LAD distal 90% stenosis.  There is a patent proximal stent. He has apparently had Cypher stenting to the distal segment.  Mar[place] Kitchen CAD (coronary artery disease)   . Carotid stenosis   . Hypercholesterolemia   . Hypertension   . Hypothyroidism   . Pneumonia    "once several years ago" (01/09/2013)  . Skin cancer of face    "couple times; off my forehead" (01/09/2013)  . Skull fracture ([place])    "when I was in the service" (01/09/2013)   Social History   Socioeconomic History  . Marital status: Married    Spouse name: Not on file  . Number of children: Not on file  . Years of education: Not on file  . Highest education level: Not on file  Occupational History   . Not on file  Social Needs  . Financial resource strain: Not on file  . Food insecurity:    Worry: Not on file    Inability: Not on file  . Transportation needs:    Medical: Not on file    Non-medical: Not on file  Tobacco Use  . Smoking status: Former Smoker    Packs/day: 1.50    Years: 20.00    Pack years: 30.00    Types: Cigarettes    Last attempt to quit: 07/19/1968    Years since quitting: 49.6  . Smokeless tobacco: Former Systems developer    Types: [place] date: 07/19/2012  Substance and Sexual Activity  . Alcohol use: Yes    Comment: 01/09/2013 "used to drink some when I was young; haven't drank for 2-3 yrs"  . Drug use: No  . Sexual activity: Never  Lifestyle  . Physical activity:    Days per week: Not on file    Minutes per session: Not on file  . Stress: Not on file  Relationships  . Social connections:    Talks on phone: Not on file    Gets together: Not on file    Attends religious service: Not on file    Active member of club or organization: Not on file    Attends meetings of clubs or organizations: Not on file    Relationship status: Not on file  Other Topics Concern  . Not on file  Social History Narrative  . Not on file   Family History  Problem Relation Age of Onset  . Stroke Mother   . Angina Father    Scheduled Meds: . aspirin  81 mg Oral Daily  . atorvastatin  40 mg Oral q1800  . enoxaparin (LOVENOX) injection  30 mg Subcutaneous Q24H  . nicotine  7 mg Transdermal Daily  . risperiDONE  0.5 mg Oral BID  . thyroid  30 mg Oral QAC breakfast   Continuous Infusions: PRN Meds:. Medications Prior to Admission:  Prior to Admission medications   Medication Sig Start Date End Date Taking? Authorizing Provider  Coenzyme Q10 (CO Q-10 PO) Take 1 tablet by mouth daily.    Yes [provider]  Cyanocobalamin (B-12 PO) Take 1 tablet by mouth daily.   Yes [provider]  Iodine, Kelp, (KELP PO) Take 1 tablet by mouth daily.    Yes [provider]  nitroGLYCERIN (NITROSTAT) 0.4 MG SL tablet Place 1 tablet (0.4 mg total) under the tongue every 5 (five) minutes as needed for chest pain. 11/16/12  Yes Martinique, Peter M, MD  Thiamine HCl (VITAMIN B-1) 100 MG tablet Take 100 mg by mouth daily.     Yes [provider]  thyroid (ARMOUR) 30 MG tablet Take 30 mg by mouth daily.     Yes [provider]  atorvastatin (LIPITOR) 10 MG tablet Take 1 tablet (10 mg total) by mouth daily at 6 PM. Patient not taking: Reported on 03/06/2018 03/03/14   Geradine Girt, DO  clopidogrel (PLAVIX) 75 MG tablet Take 1 tablet (75 mg total) by mouth daily. Patient not taking: Reported on 03/06/2018 03/03/14   Geradine Girt, DO   Allergies  Allergen Reactions  . Codeine Itching   Review of Systems  Unable to perform ROS: Mental status change    Physical Exam  Constitutional:  cachetic  HENT:  Head: Normocephalic and atraumatic.  Eyes:  Glasses in place  Pulmonary/Chest: Effort normal. He has no wheezes. He has no rales.  Diminished in bases  Abdominal: Soft. Bowel sounds are normal.  Skin: There is pallor.  Nursing note and vitals reviewed.   Vital Signs: BP (!) 178/75 (BP Location: Right Arm)   Pulse 86   Temp 99.1 F (37.3 C)   Resp 20   Ht 5\' 8"  (1.727 m)   Wt 48.1 kg   SpO2 100%   BMI 16.12 kg/m  Pain Scale: 0-10   Pain Score: 0-No pain   SpO2: SpO2: 100 % O2 Device:SpO2: 100 % O2 Flow Rate: .O2 Flow Rate (L/min): 2 L/min  IO: Intake/output summary:   Intake/Output Summary (Last 24 hours) at 03/09/2018 1425 Last data filed at 03/09/2018 1414 Gross per 24 hour  Intake 742.15 ml  Output 2400 ml  Net -1657.85 ml    LBM: Last BM Date: (PTA) Baseline Weight: Weight: 48.1 kg Most recent weight: Weight: 48.1 kg     Palliative Assessment/Data: PPS: 20%     Thank you for this consult. Palliative medicine will continue to follow and assist as needed.   Time In: 1400 Time Out: 1515 Time Total: 75  minutes Greater than 50%  of this time was spent counseling and coordinating care related to the above assessment and plan.  Signed by: Mariana Kaufman, AGNP-C Palliative Medicine    Please contact Palliative Medicine Team phone at [phone] for questions and concerns.  For individual provider: See Shea Evans

## 2018-03-09 NOTE — Progress Notes (Signed)
Patient had problems swallowing regular food. Will try dysphagia 3 diet until he will be evaluated by SP. Will continue to monitor.

## 2018-03-09 NOTE — Progress Notes (Signed)
CSW met with patient's family again at bedside. They have selected [facility]. They will begin insurance authorization process. Patient's spouse stated that she wants the medical team to know that they do not want any measures that would prolong patient's life and cause him harm. CSW discussed that Palliative Care would be a great resource in helping to better explain Code Status decisions. CSW will alert MD and notes a Palliative consult has already been placed.     LCSW [phone]  

## 2018-03-09 NOTE — Progress Notes (Signed)
Physical Therapy Treatment Patient Details Name: Ronald English MRN: [id number] DOB: 26-Aug-1925 Today's Date: 03/09/2018    History of Present Illness This is a 84 year old male with a history of hypothyroidism, HLD, HTN, CVA (2015) and carotid stenosis presenting with altered mental status.     PT Comments    Patient remains confused and is not oriented, mumbling nonsensical speech throughout session. Continues to require frequent redirection to task and increased encouragement to perform mobility. However, patient does demonstrate improvement in functional mobility, requiring two person minimal assistance for transferring from bed to chair. Discharge plan remains appropriate.     Follow Up Recommendations  SNF;Supervision/Assistance - 24 hour     Equipment Recommendations  Other (comment)(defer to next venue)    Recommendations for Other Services       Precautions / Restrictions Precautions Precautions: Fall Restrictions Weight Bearing Restrictions: No    Mobility  Bed Mobility Overal bed mobility: Needs Assistance Bed Mobility: Supine to Sit     Supine to sit: Mod assist;+2 for safety/equipment     General bed mobility comments: affected by confusion/difficulty with maintaining attention to task and following commands; mod assist provided for progressing BLE's off of bed and elevation of trunk  Transfers Overall transfer level: Needs assistance Equipment used: 2 person hand held assist Transfers: Sit to/from Stand;Stand Pivot Transfers Sit to Stand: +2 physical assistance;Min assist Stand pivot transfers: +2 physical assistance;Min assist       General transfer comment: affected by confusion/difficulty with task. Requiring increased encouragement but able to progress to chair with min assist + 2. Cues for movement initiation.  Ambulation/Gait             General Gait Details: deferred   Stairs             Wheelchair Mobility    Modified  Rankin (Stroke Patients Only) Modified Rankin (Stroke Patients Only) Pre-Morbid Rankin Score: Slight disability Modified Rankin: Severe disability     Balance Overall balance assessment: Needs assistance   Sitting balance-Leahy Scale: Fair       Standing balance-Leahy Scale: Poor                              Cognition Arousal/Alertness: Awake/alert Behavior During Therapy: Restless;Agitated Overall Cognitive Status: Impaired/Different from baseline Area of Impairment: Orientation;Attention;Memory;Awareness;Safety/judgement;Following commands;Problem solving                 Orientation Level: Disoriented to;Place;Time;Situation Current Attention Level: Focused Memory: Decreased recall of precautions;Decreased short-term memory Following Commands: Follows one step commands inconsistently Safety/Judgement: Decreased awareness of safety;Decreased awareness of deficits Awareness: Intellectual Problem Solving: Slow processing;Decreased initiation;Difficulty sequencing;Requires verbal cues;Requires tactile cues General Comments: Not oriented. Mumbling nonsensical speech. Does not answer questions appropriately      Exercises      General Comments General comments (skin integrity, edema, etc.): Mitts donned      Pertinent Vitals/Pain Pain Assessment: Faces Faces Pain Scale: No hurt    Home Living                      Prior Function            PT Goals (current goals can now be found in the care plan section) Acute Rehab PT Goals Patient Stated Goal: per family for pt to get back to normal PT Goal Formulation: With patient/family Time For Goal Achievement: 03/21/18 Potential to Achieve Goals: Good Progress  towards PT goals: Progressing toward goals    Frequency    Min 2X/week      PT Plan Current plan remains appropriate    Co-evaluation              AM-PAC PT "6 Clicks" Daily Activity  Outcome Measure  Difficulty  turning over in bed (including adjusting bedclothes, sheets and blankets)?: Unable Difficulty moving from lying on back to sitting on the side of the bed? : Unable Difficulty sitting down on and standing up from a chair with arms (e.g., wheelchair, bedside commode, etc,.)?: Unable Help needed moving to and from a bed to chair (including a wheelchair)?: A Lot Help needed walking in hospital room?: Total Help needed climbing 3-5 steps with a railing? : Total 6 Click Score: 7    End of Session Equipment Utilized During Treatment: Gait belt Activity Tolerance: Patient tolerated treatment well Patient left: in chair;with call bell/phone within reach;with chair alarm set;with nursing/sitter in room Nurse Communication: Mobility status PT Visit Diagnosis: Unsteadiness on feet (R26.81);Other abnormalities of gait and mobility (R26.89);Difficulty in walking, not elsewhere classified (R26.2)     Time: 0938-1829 PT Time Calculation (min) (ACUTE ONLY): 18 min  Charges:  $Therapeutic Activity: 8-22 mins                     Ellamae Sia, PT, DPT Acute Rehabilitation Services  Pager: [place] 03/09/2018, 10:18 AM

## 2018-03-09 NOTE — Progress Notes (Signed)
   Subjective: Patient was doing okay, no acute events overnight. His wife, daughter and son in-law was present. They reported that he seemed more calm today compared to yesterday but still is very confused and has moments where he will be talking about things at home and then he becomes confused. He denies any pain at the moment, sitting in bed pleasantly confused. Palliative care visited and will continue the discussion tomorrow.   Objective:  Vital signs in last 24 hours: Vitals:   03/07/18 2201 03/08/18 0628 03/08/18 2050 03/09/18 0425  BP: (!) 152/89 (!) 141/64 (!) 187/85 (!) 178/75  Pulse: 65 64 82 86  Resp: 20 20  20   Temp: 98 F (36.7 C) 98.4 F (36.9 C) 98.7 F (37.1 C) 99.1 F (37.3 C)  TempSrc: Oral     SpO2: 94% 99% 100% 100%  Weight:      Height:        General: Frail appearing male, no acute distress, laying in bed Cardiac: RRR, normal S1, S2, no murmurs, rubs or gallops  Pulmonary: Lungs CTA bilaterally, no wheezing Extremity: No LE edema, no muscle atrophy, Neuro: Awake, not oriented to person or place, appeared to be seeing things on the ceiling, responded only intermittently Psychiatry: Pleasant mood, normal affect    Assessment/Plan: This is a 84 year old male with a history of hypothyroidism, HLD, HTN, carotid stenosispresenting with altered mental status and transient loss of consciousness.  Transient loss of consciousness:Patient was calm today, was resting comfortably. His lipids, A1c, B12 were all normal. He did have atherosclerosis of his left and right ICAs, vascular evaluated and they did not recommend stent placement at this time. There is a concern about cerebral hypoperfusion due to his atherosclerosis and transient hypotension. Patient has had no loss of consciousness while in the hospital. -OT/PT evaluated and recommended SNF placement -Discontinue maintenance fluids -Continue ASA 81 mg -CSW to assist with SNF placement -Up in chair with  meals  Delirium: Patient has been very agitated and confused while in the hospital. This is likely due to hospital environment, it can also be from his untreated hypothyroidism. We started him on rispiradone yesterday to help with his agitation, he appears calmer today. Palliative care was consulted to discuss case with family. Unclear how much function he will regain.  -Rispiradone 0.5mg  in AM and 1mg  in PM  -Continue with sitter today -CSW assisting with SNF -Palliative care on board   Hypothyroidism: Patient was found to have elevated TSH on admission.  -T4 0.62  -Continue Armour 20mg  PO  AKI: BUN24, Cr 1.34 on admission, elevated from the last BUN 15 and Cr 0.85 in 2015. He has been on maintenance fluids, 8.21 BUN 18 and Cr 1.11.  FEN: No fluids, replete lytes prn, regular diet  VTE ppx: Lovenox  Code Status: FULL   Dispo: Anticipated discharge in approximately 1-2 day(s).   Asencion Noble, MD 03/09/2018, 6:32 AM Pager: [phone]

## 2018-03-09 NOTE — Progress Notes (Signed)
Internal Medicine Attending:   I saw and examined the patient. I reviewed the resident's note and I agree with the resident's findings and plan as documented in the resident's note.  Hospital day #4 with acute metabolic encephalopathy/hospital delirium.  I met with his family at the bedside, we started risperidone yesterday for agitation and high risk for falls.  They report that he has been doing better, more calm, less agitated.  Nursing still notes that he tried to get up out of bed several times last nights, they were able to move him closer to the nurses station today.  Nursing does not think he is ready to go without a sitter quite yet.  I agree with increasing dose of risperidone to 0.5 mg during the day and 1 mg in the evening.  Continue with the standard delirium precautions.  We need to improve his agitation prior to transfer to skilled nursing facility.

## 2018-03-10 ENCOUNTER — Inpatient Hospital Stay (HOSPITAL_COMMUNITY): Payer: Medicare Other

## 2018-03-10 DIAGNOSIS — E876 Hypokalemia: Secondary | ICD-10-CM

## 2018-03-10 LAB — CBC
HCT: 35.9 % — ABNORMAL LOW (ref 39.0–52.0)
Hemoglobin: 12.4 g/dL — ABNORMAL LOW (ref 13.0–17.0)
MCH: 30.8 pg (ref 26.0–34.0)
MCHC: 34.5 g/dL (ref 30.0–36.0)
MCV: 89.1 fL (ref 78.0–100.0)
PLATELETS: 158 10*3/uL (ref 150–400)
RBC: 4.03 MIL/uL — ABNORMAL LOW (ref 4.22–5.81)
RDW: 12.6 % (ref 11.5–15.5)
WBC: 13.6 10*3/uL — ABNORMAL HIGH (ref 4.0–10.5)

## 2018-03-10 LAB — BASIC METABOLIC PANEL
Anion gap: 12 (ref 5–15)
BUN: 15 mg/dL (ref 8–23)
CHLORIDE: 102 mmol/L (ref 98–111)
CO2: 24 mmol/L (ref 22–32)
CREATININE: 1.2 mg/dL (ref 0.61–1.24)
Calcium: 9.2 mg/dL (ref 8.9–10.3)
GFR calc Af Amer: 60 mL/min — ABNORMAL LOW (ref >60)
GFR, EST NON AFRICAN AMERICAN: 52 mL/min — AB (ref >60)
Glucose, Bld: 104 mg/dL — ABNORMAL HIGH (ref 70–99)
Potassium: 3.2 mmol/L — ABNORMAL LOW (ref 3.5–5.1)
SODIUM: 138 mmol/L (ref 135–145)

## 2018-03-10 MED ORDER — POTASSIUM CHLORIDE 10 MEQ/100ML IV SOLN
10.0000 meq | INTRAVENOUS | Status: AC
  Administered 2018-03-10 (×6): 10 meq via INTRAVENOUS
  Filled 2018-03-10 (×6): qty 100

## 2018-03-10 NOTE — Progress Notes (Signed)
   Subjective: Patient was resting comfortably when we saw him. No acute events overnight. No family at bedside. He was more alert today and answering questions in an appropriate manner. He said that there was pain around where the IV potassium was being given but denied any other areas of pain. He appears to be much more aware of his surroundings.   Objective:  Vital signs in last 24 hours: Vitals:   03/09/18 0425 03/09/18 1617 03/09/18 2158 03/10/18 0546  BP: (!) 178/75 (!) 167/90 (!) 169/105 (!) 168/95  Pulse: 86 84 65 85  Resp: 20 20 20 18   Temp: 99.1 F (37.3 C) 97.9 F (36.6 C) 98.4 F (36.9 C) 98.5 F (36.9 C)  TempSrc:  Oral Oral Oral  SpO2: 100%  100% 100%  Weight:      Height:        General: Frail appearing male, no acute distress, alert and oriented Cardiac: RRR, normal S1, S2, no murmurs, rubs or gallops Pulmonary: Lungs CTA bilaterally, no wheezing Abdomen: Soft, non-tender, +bowel sounds Extremity: Generalized muscle weakness Neuro: Alert and oriented to place, more awake and answering questions appropriately Psychiatry: Pleasant mood and affect     Assessment/Plan: This is a 84 year old male with a history of hypothyroidism, HLD, HTN, carotid stenosispresenting with altered mental statusand transient loss of consciousness.   Transient loss of consciousness:Likely 2/2 cerebral hypoperfusion due to carotid artery atherosclerosis and orthostatic hypotension. Maintenance fluids were stopped yesterday.  -OT/PT evaluated and recommended SNF placement -Continue ASA 81 mg -CSW to assist with SNF placement -Up in chair with meals  Delirium:Patient has been very agitated and confused while in the hospital. This is likely due to hospital environment, it can also be from his untreated hypothyroidism. We started him on rispiradone yesterday to help with his agitation, he appears calmer today. Palliative care was consulted to discuss case with family. Unclear how much  function he will regain.  -Rispiradone 0.5mg  in AM and 1mg  in PM -Discontinue sitter today -SLP: recommended NPO for now until palliative meets with family, a MBS can be done tomorrow if more aggressive care is wanted vs comfort feeds. -Palliative care on board and will meet with family today  Hypokalemia -8/23 K3.2, replete with IV K -Check Mag, replete as needed -Recheck K  Hypothyroidism: Patient was found to have elevated TSH on admission.  -T4 0.62  -Continue Armour 20mg  PO  DUK:GUR42, Cr 1.34 on admission, elevated from the last BUN 15 and Cr 0.85 in 2015. He has been on maintenance fluids, 8.21 BUN 18 and Cr 1.11. 8/23 BUN 15, Cr 1.2.   FEN:No fluids, replete lytes prn,dysphagia  VTE ppx: Lovenox  Code Status: FULL  Dispo: Anticipated discharge in approximately 1-2 day(s  Asencion Noble, MD 03/10/2018, 6:26 AM Pager: [phone]

## 2018-03-10 NOTE — Progress Notes (Signed)
Echocardiogram was unremarkable with regard to possible cardioembolic source.   MRI pending but may not be able to obtain due to delirium.  Vascular Surgery plan for R ICA 80% increased from 50% stenosis compared to CTA in 2015 is to follow stroke work up to determine if there is an indication for right ICA revascularization in the near future.  Per Vascular Surgery, he would be a candidate for trans-carotid arterial stenting and it appears and this would probably be the preferred route for a patient his age with comorbid conditions.   Untreated hypothyroidism seems relatively likely as the etiology for the patient's delirium, in addition to a hospital delirium due to unfamiliar environment and disruptions to sleep/wake cycle that can occur with geriatric patients during hospital stays.   Neurology will sign off for now. Please call us when MRI is obtained.   Electronically signed: Dr. Kerney Elbe

## 2018-03-10 NOTE — Care Management Important Message (Signed)
Important Message  Patient Details  Name: Ronald English MRN: [id number] Date of Birth: [birth year]   Medicare Important Message Given:  Yes    Nasim Garofano Montine Circle 03/10/2018, 3:41 PM
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
## 2018-03-10 NOTE — Progress Notes (Signed)
No charge note:  Noted chest xray with no current aspiration infiltrates.  Called patient's son and discussed results. He stated patient was much more alert and felt his mental status was returning to baseline.   Will re-order SLP eval to see if dysphagia has improved with improvement of mental status.   PMT will f/u after SLP eval for continued GOC.   Mariana Kaufman, AGNP-C Palliative Medicine  Please call Palliative Medicine team phone with any questions [phone]. For individual providers please see AMION.

## 2018-03-10 NOTE — Progress Notes (Signed)
Daily Progress Note   Patient Name: Ronald English       Date: 03/10/2018 DOB: 07-30-1925  Age: 84 y.o. MRN#: [id number] Attending Physician: Axel Filler, * Primary Care Physician: Levin Erp, MD Admit Date: 03/06/2018  Reason for Consultation/Follow-up: Establishing goals of care  Subjective: Met with patient's children and spouse.  Married to spouse for 62 years. Previously worked as an Medical sales representative.  Reviewed patient's prior nutritional and functional status- prior to admission patient was independent with ADL's, however, family notes that he has become weaker over the last several months. He has spent much more time sleeping, waking up later, going to bed earlier and sleeping during the day. He has had some evidence of dementia and confusion. There are times when he wouldn't recognize his daughter. He has forgotten how to use his remote control.  He has also had problems eating. They have noted he has stopped eating as much and has started taking a great amount of time to eat. They recall going to dinner and he required two hours to eat his meal. He has lost significant amounts of weight and he does appear cachetic.  Elicited values important to the patient- his independence and being able to care for himself. Family states that he would not want to continue to live in his current state or worse. If he was not to be able to return to his previous state of function he would not want his life prolonged. We discussed code status- Family agrees that if patient did not have pulse and was not breathing that they would not want CPR performed in efforts to resuscitate him as this would not increase his function and would likely do more harm and leave him in a worse  state. We discussed SLP findings of overt aspiration and dysphagia. Family inquiring if he has been aspirating if he has pneumonia- could treating pnuemonia improve his confusion and health state? We discussed that he is not taking in enough nutrition and fluids to support life. We can proceed with comfort feeding, but there is high risk he has pneumonia and will get it if he doesn't have it already. At close of meeting family requested chest xray to eval for current pneumonia to ensure there is no other treatable problems. If there is no  pneumonia- and confusion is likely related to hospital delirium/TIA/progressing vascular dementia, they would like to proceed with comfort care and Hospice- patient would be eligible for Hospice house due to severe dysphagia, confusion, not eating and drinking.   ROS  Length of Stay: 3  Current Medications: Scheduled Meds:  . aspirin  81 mg Oral Daily  . atorvastatin  40 mg Oral q1800  . enoxaparin (LOVENOX) injection  30 mg Subcutaneous Q24H  . nicotine  7 mg Transdermal Daily  . risperiDONE  0.5 mg Oral Daily  . risperiDONE  1 mg Oral QHS  . thyroid  30 mg Oral QAC breakfast    Continuous Infusions: . potassium chloride 10 mEq (03/10/18 1549)    PRN Meds:   Physical Exam  Constitutional:  cachetic  HENT:  Head: Normocephalic and atraumatic.  Cardiovascular: Normal rate and regular rhythm.  Pulmonary/Chest:  Diminished in bases  Skin: Skin is warm and dry.  Nursing note and vitals reviewed.           Vital Signs: BP 135/87 (BP Location: Right Arm)   Pulse 74   Temp 97.8 F (36.6 C) (Oral)   Resp 16   Ht '5\' 8"'  (1.727 m)   Wt 48.1 kg   SpO2 100%   BMI 16.12 kg/m  SpO2: SpO2: 100 % O2 Device: O2 Device: Room Air O2 Flow Rate: O2 Flow Rate (L/min): 2 L/min  Intake/output summary:   Intake/Output Summary (Last 24 hours) at 03/10/2018 1619 Last data filed at 03/10/2018 1549 Gross per 24 hour  Intake 750 ml  Output 800 ml  Net -50  ml   LBM: Last BM Date: (PTA) Baseline Weight: Weight: 48.1 kg Most recent weight: Weight: 48.1 kg       Palliative Assessment/Data: PPS: 20%      Patient Active Problem List   Diagnosis Date Noted  . Palliative care by specialist   . Advance care planning   . Goals of care, counseling/discussion   . TIA (transient ischemic attack) 03/07/2018  . Aortic atherosclerosis ([place]) 03/06/2018  . Altered mental status 03/06/2018  . CVA (cerebral infarction) 03/01/2014  . Encephalopathy 02/28/2014  . H/O class II angina pectoris 11/16/2012  . CAD (coronary artery disease)   . H/O: hypothyroidism   . Carotid stenosis 01/18/2011  . Anteroseptal myocardial infarction ([place])   . Hypercholesterolemia     Palliative Care Assessment & Plan   Patient Profile: 84 y.o. male  with past medical history of hypothyroidism hyperlipidemia hypertension CVA in 2015 and carotid stenosis admitted on 03/06/2018 with altered mental status.  Per his wife he was sitting at the kitchen table and his eyes rolled back in his head and he slumped forward.  EMS was called and a code stroke was initiated. CT/CTA scans showed small infarcts of undetermined age, and progressing carotid disease with right ICA stenosis increased to 80% from 50% in 2015.  AMS contributed to hypoperfusion.  Vascular surgery consulted without recommendations for intervention at this time.  During admission patient has remained confused and agitated at times.  He has also had a few falls.  Palliative medicine consulted for goals of care.  Assessment/Recommendations/Plan   Ongoing confusion in the setting of what sounds like already present vascular dementia history- SLP noted significant oral dysphagia and overt aspiration- if patient does not recover mental status and ability to eat, family would prefer transition to comfort care and residential Hospice facility  Will obtain chest x-ray to r/o current aspiration pneumonia per  family request  to ensure this isn't contributing to patient's confusion and dysphagia- patient with leukocytosis, spouse reports productive cough prior to admission, lung sounds diminished in bases and SLP  eval with signs of ongoing aspiration so I feel this is reasonable although his respiratory status has been stable with some desats early on in admission  Plan to call patient's son with results of chest x-ray and further discussion of GOC this evening  DNR   Goals of Care and Additional Recommendations:  Limitations on Scope of Treatment: Full Scope Treatment  Code Status:  DNR  Prognosis:   Unable to determine  Discharge Planning:  To Be Determined  Care plan was discussed with patient's family.  Thank you for allowing the Palliative Medicine Team to assist in the care of this patient.   Time In: 1400 Time Out: 1515 Total Time 75 minutes Prolonged Time Billed Yes      Greater than 50%  of this time was spent counseling and coordinating care related to the above assessment and plan.  Mariana Kaufman, AGNP-C Palliative Medicine   Please contact Palliative Medicine Team phone at [phone] for questions and concerns.

## 2018-03-10 NOTE — Progress Notes (Signed)
Internal Medicine Attending:   I saw and examined the patient. I reviewed the resident's note and I agree with the resident's findings and plan as documented in the resident's note.  Acute metabolic encephalopathy/hospital delirium is improved today, on exam he was alert, answers simple questions, was able to tell us that he was in the hospital.  Still not back to baseline.  Family is at his bedside.  Okay to discontinue sitter precaution, no need for restraints.  We will continue with risperidone 0.5 mg in the morning and 1 mg in the evening.  Speech therapy is working with him closely, very delayed swallow, n.p.o. for now.  Anticipate he is going to be a high risk for aspiration going forward, but not sure there is much we can do about it.  We will need to continue to talk with the family, also working towards palliative goals of care.

## 2018-03-11 DIAGNOSIS — R55 Syncope and collapse: Secondary | ICD-10-CM

## 2018-03-11 DIAGNOSIS — I6529 Occlusion and stenosis of unspecified carotid artery: Secondary | ICD-10-CM

## 2018-03-11 NOTE — Progress Notes (Addendum)
   Subjective: Ronald English was doing well, no acute events overnight. No family at bedside. He slept for most of the night. Sitting in bed comfortable with no agitation. He was still confused and not responding to questions as much this morning.   Objective:  Vital signs in last 24 hours: Vitals:   03/10/18 0546 03/10/18 1418 03/10/18 2141 03/11/18 0442  BP: (!) 168/95 135/87 (!) 168/71 138/66  Pulse: 85 74 94 80  Resp: '18 16 18 20  '$ Temp: 98.5 F (36.9 C) 97.8 F (36.6 C) 98.6 F (37 C) 98.5 F (36.9 C)  TempSrc: Oral Oral Oral Oral  SpO2: 100% 100% 99% 100%  Weight:      Height:        General: Frail appearing male, NAD, awake, white film in mouth Cardiac: RRR, normal S1, S2, no murmurs, rubs or gallops  Pulmonary: Lungs CTA bilaterally, no wheezing, rhonchi or rales  Abdomen: Soft, non-tender, +bowel sounds Neuro: Awake, confused, not answering questions or following commands Psychiatry: Pleasant mood and affect    Assessment/Plan: This is a 84 year old male with a history of hypothyroidism, HLD, HTN, carotid stenosispresenting with altered mental statusand transient loss of consciousness.   Delirium:Patient has been very agitated and confused while in the hospital, requiring a sitter and soft restriants to prevent him from harming himself at one point. These have been discontinued. He has been started on rispiredone which has helped with the agitation.He still appears to be confused today, was not answering questions and following commands. He did have a white film in his mouth today, did not look like a candidal infection, more like medication and very dry mouth. He is much calmer now and will likely be able to be discharged soon, either to a hospice care or SNF.  -Palliative care met with family yesterday to discuss goals of care: Ordered a CXR to r/o aspiration pneumonia which was negative. They decided that if patient is unable to recover mental status and ability to  eat then the family would prefer to transition to comfort care and residential hospice facility.  -Rispiradone 0.'5mg'$ in AM and '1mg'$ in PM -SLP: recommended NPO for now until palliative meets with family, will repeat SLP today -Oral care protocol  Transient loss of consciousness:Likely 2/2 cerebral hypoperfusion due to carotid artery atherosclerosis and orthostatic hypotension.  -OT/PT evaluated and recommended SNF placement -Continue ASA 81 mg -CSW to assist with SNF placement -Up in chair with meals  Hypokalemia -8/23 K3.2, replete with IV K -Check Mag, replete as needed  Hypothyroidism: Patient was found to have elevated TSH on admission.  -T4 0.62  -Continue Armour '20mg'$  PO  ZOX:[id number], Cr 1.34 on admission, elevated from the last BUN 15 and Cr 0.85 in 2015. He has been on maintenance fluids, 8.21BUN 18 and Cr 1.11. 8/23 BUN 15, Cr 1.2.   FEN:No fluids, replete lytes prn,dysphagia  VTE ppx: Lovenox  Code Status: DNR  Dispo: Anticipated discharge pending SNF placement.   Asencion Noble, MD 03/11/2018, 6:12 AM Pager: [phone]

## 2018-03-11 NOTE — Progress Notes (Signed)
Internal Medicine Attending:   I saw and examined the patient. I reviewed the resident's note and I agree with the resident's findings and plan as documented in the resident's note.  Patient appears comfortable and is not agitated today.  He is asking for water.  He remains confused.  We will continue with risperidone 0.5 mg in the a.m. and 1 mg in p.m.  Palliative care to follow-up with patient and family and address goals of care.  Patient is likely a high aspiration risk and is n.p.o. for now.  SLP to follow-up with him.  No further work-up at this time.

## 2018-03-11 NOTE — Plan of Care (Signed)
Patient is making progress toward discharge, no agitation noted on this shift, patient slept for most part of the night. No acute changes noted.

## 2018-03-12 ENCOUNTER — Inpatient Hospital Stay (HOSPITAL_COMMUNITY): Payer: Medicare Other

## 2018-03-12 LAB — BASIC METABOLIC PANEL
Anion gap: 14 (ref 5–15)
BUN: 36 mg/dL — ABNORMAL HIGH (ref 8–23)
CALCIUM: 9.4 mg/dL (ref 8.9–10.3)
CO2: 22 mmol/L (ref 22–32)
CREATININE: 1.21 mg/dL (ref 0.61–1.24)
Chloride: 105 mmol/L (ref 98–111)
GFR calc Af Amer: 59 mL/min — ABNORMAL LOW (ref >60)
GFR calc non Af Amer: 51 mL/min — ABNORMAL LOW (ref >60)
GLUCOSE: 92 mg/dL (ref 70–99)
Potassium: 3.7 mmol/L (ref 3.5–5.1)
Sodium: 141 mmol/L (ref 135–145)

## 2018-03-12 MED ORDER — RESOURCE THICKENUP CLEAR PO POWD
ORAL | Status: DC | PRN
  Filled 2018-03-12: qty 125

## 2018-03-12 NOTE — Progress Notes (Signed)
Facility has not received insurance authorization for patient's disposition to SNF.  Facility may have insurance authorization on Monday.  CSW will continue to follow up with facility.   Reed Breech LCSWA [phone]

## 2018-03-12 NOTE — Progress Notes (Signed)
   Subjective: Ronald English tell me that he is hungry today and would like to try eating something. He has a grandson at bedside who has questions about delirium and was updated on the plan.   Objective:  Vital signs in last 24 hours: Vitals:   03/11/18 1359 03/11/18 2147 03/12/18 0523 03/12/18 1429  BP: (!) 150/64 108/62 (!) 156/75 (!) 148/76  Pulse: 74 87 92 89  Resp: 20 18 18 16   Temp: 97.9 F (36.6 C) 97.7 F (36.5 C) 97.9 F (36.6 C) 98.2 F (36.8 C)  TempSrc: Oral Oral Oral Oral  SpO2: 96% 95% 95% 98%  Weight:      Height:       General: oriented to self, makes eye contact and answers questions appropriately but confusion limits ability to hold a conversation about the present, identifies a grandson who is in the room as a cousin  Cardiac: regular rate and rhythm  Pulm:   Assessment/Plan:  Active Problems:   Aortic atherosclerosis (HCC)   Altered mental status   TIA (transient ischemic attack)   Palliative care by specialist   Advance care planning   Goals of care, counseling/discussion  Delirium vascular dementia  - agitation has improved since starting risperidone  - family notes that he has fluctuations in orientation throughout the day -  Will attempt to contact SLP today, a barium swallow was ordered the last time that they saw him and noticed oropharyngeal dysphasia. He seems more awake now and I am curious if he may be able to better tolerate an evaluation today.  - CM contacted today, they will check in on the status of SNF placement   Hypothyroidism  - continue armour 20 mg qd  - will need follow up thyroid testing after discharge   Dispo: Anticipated discharge 1-2   Ronald Noss, MD 03/12/2018, 3:03 PM Pager: [phone]

## 2018-03-12 NOTE — Progress Notes (Signed)
Modified Barium Swallow Progress Note  Patient Details  Name: Ronald English MRN: [id number] Date of Birth: 1926/04/07  Today's Date: 03/12/2018  Modified Barium Swallow completed.  Full report located under Chart Review in the Imaging Section.  Brief recommendations include the following:  Clinical Impression  Pt has a moderate-severe oral dysphagia that is likely cognitively-based; a mild pharyngeal dysphagia that is more structurally-based; and a suspected esophageal component, although MD was not present for confirmation. His oral preparation is disorganized, prolonged with lingual rocking, and easily interrupted by minimal environmental distractions, leading to anterior spillage. Bolus formation of a small piece of soft solid took several minutes, ultimately requiring liquid wash to clear. Premature spillage, combined with mildly reduced epiglottic deflection due to anterior curvature of his spine, allows for thin liquids to be penetrated and aspirated before the swallow, which can never be fully cleared across the duration of the study. He has improved airway protection with spoonfuls of thin liquids, larger volumes of nectar thick liquids and solids. Upon brief esopahgeal scan, there appeared to be barium remaining throughout the esophagus. After the study was completed, pt had a mildly prolonged episode of coughing, which could have been contributed to potential esophageal issues (sensation of barium still present versus retrograde flow), but unfortunately this was not recorded. Recommend starting with Dys 1 diet and nectar thick liquids to allow pt to begin PO intake today, wiht SLP f/u to assess potential for using provale cup to reintroduce thin liquids. Encourage further discussions with palliative care regarding [place] moving forward, as he appears to have chronic issues related to his dysphagia. Although nectar thick liquids can reduce the risk of aspiration, they may not be a suitable  long-term option given his suspect esopahgeal dysphagia and already reduced PO intake, and his mental status could put him at risk episodic aspiration regardless of consistency.    Swallow Evaluation Recommendations       SLP Diet Recommendations: Dysphagia 1 (Puree) solids;Nectar thick liquid   Liquid Administration via: Straw   Medication Administration: Crushed with puree   Supervision: Staff to assist with self feeding;Full supervision/cueing for compensatory strategies   Compensations: Minimize environmental distractions;Slow rate;Small sips/bites;Follow solids with liquid   Postural Changes: Remain semi-upright after after feeds/meals (Comment);Seated upright at 90 degrees   Oral Care Recommendations: Oral care BID   Other Recommendations: Order thickener from pharmacy;Prohibited food (jello, ice cream, thin soups);Remove water pitcher;Have oral suction available    Germain Osgood 03/12/2018,5:13 PM   Germain Osgood, M.A. CCC-SLP [phone]

## 2018-03-13 DIAGNOSIS — Z66 Do not resuscitate: Secondary | ICD-10-CM

## 2018-03-13 DIAGNOSIS — F0150 Vascular dementia without behavioral disturbance: Secondary | ICD-10-CM

## 2018-03-13 DIAGNOSIS — R1310 Dysphagia, unspecified: Secondary | ICD-10-CM

## 2018-03-13 MED ORDER — RESOURCE THICKENUP CLEAR PO POWD: 1.0000 | ORAL | Status: DC | PRN

## 2018-03-13 MED ORDER — THYROID 30 MG PO TABS: 30.0000 mg | ORAL_TABLET | Freq: Every day | ORAL | 0 refills | Status: DC

## 2018-03-13 MED ORDER — ATORVASTATIN CALCIUM 40 MG PO TABS: 40.0000 mg | ORAL_TABLET | Freq: Every day | ORAL | 0 refills | Status: DC

## 2018-03-13 MED ORDER — NICOTINE 7 MG/24HR TD PT24: 7.0000 mg | MEDICATED_PATCH | Freq: Every day | TRANSDERMAL | 0 refills | Status: DC

## 2018-03-13 MED ORDER — ASPIRIN 81 MG PO CHEW: 81.0000 mg | CHEWABLE_TABLET | Freq: Every day | ORAL | 0 refills | Status: DC

## 2018-03-13 MED ORDER — RISPERIDONE 1 MG PO TBDP: 1.0000 mg | ORAL_TABLET | Freq: Every day | ORAL | 0 refills | Status: DC

## 2018-03-13 MED ORDER — RISPERIDONE 0.5 MG PO TBDP: 0.5000 mg | ORAL_TABLET | Freq: Every day | ORAL | 0 refills | Status: DC

## 2018-03-13 NOTE — Progress Notes (Addendum)
Daily Progress Note   Patient Name: Ronald English       Date: 03/13/2018 DOB: November 03, 1925  Age: 84 y.o. MRN#: [id number] Attending Physician: Axel Filler, * Primary Care Physician: Levin Erp, MD Admit Date: 03/06/2018  Reason for Consultation/Follow-up: Establishing goals of care  Subjective: Patient participating with PT. Walked to door and able to sit up in chair. MBS results noted with recommendations for dysphagia 1 diet and nectar thick liquids.  No family at bedside. Attempted to call son without answer.   ROS  Length of Stay: 6  Current Medications: Scheduled Meds:  . aspirin  81 mg Oral Daily  . atorvastatin  40 mg Oral q1800  . enoxaparin (LOVENOX) injection  30 mg Subcutaneous Q24H  . nicotine  7 mg Transdermal Daily  . risperiDONE  0.5 mg Oral Daily  . risperiDONE  1 mg Oral QHS  . thyroid  30 mg Oral QAC breakfast    Continuous Infusions:   PRN Meds: RESOURCE THICKENUP CLEAR  Physical Exam          Vital Signs: BP (!) 149/60 (BP Location: Left Arm)   Pulse 70   Temp 98.3 F (36.8 C) (Oral)   Resp 18   Ht 5\' 8"  (1.727 m)   Wt 48.1 kg   SpO2 99%   BMI 16.12 kg/m  SpO2: SpO2: 99 % O2 Device: O2 Device: Room Air O2 Flow Rate: O2 Flow Rate (L/min): 2 L/min  Intake/output summary:   Intake/Output Summary (Last 24 hours) at 03/13/2018 1254 Last data filed at 03/13/2018 0600 Gross per 24 hour  Intake -  Output 600 ml  Net -600 ml   LBM: Last BM Date: (PTA) Baseline Weight: Weight: 48.1 kg Most recent weight: Weight: 48.1 kg       Palliative Assessment/Data:      Patient Active Problem List   Diagnosis Date Noted  . Palliative care by specialist   . Advance care planning   . Goals of care, counseling/discussion   . TIA  (transient ischemic attack) 03/07/2018  . Aortic atherosclerosis ([place]) 03/06/2018  . Altered mental status 03/06/2018  . CVA (cerebral infarction) 03/01/2014  . Encephalopathy 02/28/2014  . H/O class II angina pectoris 11/16/2012  . CAD (coronary artery disease)   . H/O: hypothyroidism   . Carotid stenosis  01/18/2011  . Anteroseptal myocardial infarction ([place])   . Hypercholesterolemia     Palliative Care Assessment & Plan   Patient Profile:  84 y.o. male  with past medical history of hypothyroidism hyperlipidemia hypertension CVA in 2015 and carotid stenosis admitted on 03/06/2018 with altered mental status.  Per his wife he was sitting at the kitchen table and his eyes rolled back in his head and he slumped forward.  EMS was called and a code stroke was initiated. CT/CTA scans showed small infarcts of undetermined age, and progressing carotid disease with right ICA stenosis increased to 80% from 50% in 2015.  AMS contributed to hypoperfusion.  Vascular surgery consulted without recommendations for intervention at this time.  During admission patient has remained confused and agitated at times.  He has also had a few falls.  Palliative medicine consulted for goals of care. Assessment/Recommendations/Plan   Patient's mental status improved since last eval, do not think he is imminently dying  Family has previously elicited [place] to be no life prolonging measures in the event that they would harm patient or would not improve his functional status  Per SW he has been approved for SNF  Would recommend outpatient Palliative to follow at SNF vs home with Hospice - attempted to call patient's son, left message, no family at bedside.  Goals of Care and Additional Recommendations:  Limitations on Scope of Treatment: Full Scope Treatment  Code Status:  DNR  Prognosis:   Unable to determine  Discharge Planning:  [place] for rehab with Palliative care service  follow-up    Thank you for allowing the Palliative Medicine Team to assist in the care of this patient.   Time In: 1200 Time Out: 1225 Total Time 25 minutes Prolonged Time Billed no      Greater than 50%  of this time was spent counseling and coordinating care related to the above assessment and plan.  Mariana Kaufman, AGNP-C Palliative Medicine   Please contact Palliative Medicine Team phone at [phone] for questions and concerns.

## 2018-03-13 NOTE — Progress Notes (Signed)
   Subjective: Patient was doing okay, sleeping comfortably in bed. No acute events overnight. No family at bedside. Patient was answering questions appropriately, still had some confusion. We talked about the plan with him and he is in agreement.   Objective:  Vital signs in last 24 hours: Vitals:   03/12/18 0523 03/12/18 1429 03/12/18 2023 03/13/18 0556  BP: (!) 156/75 (!) 148/76 (!) 163/82 (!) 149/60  Pulse: 92 89 (!) 101 70  Resp: 18 16 16 18   Temp: 97.9 F (36.6 C) 98.2 F (36.8 C) 98.3 F (36.8 C)   TempSrc: Oral Oral Oral   SpO2: 95% 98% 93% 99%  Weight:      Height:        General: Frail elderly male, no acute distress Cardiac: RRR, no murmurs Pulmonary: Lungs CTA, no wheezes Neuro: Awake, confused, answering questions appropriately   Assessment/Plan:  Active Problems:   Aortic atherosclerosis (HCC)   Altered mental status   TIA (transient ischemic attack)   Palliative care by specialist   Advance care planning   Goals of care, counseling/discussion This is a 84 year old male with a history of hypothyroidism, HLD, HTN, carotid stenosispresenting with altered mental statusand transient loss of consciousness.   Vascular dementia Delirium: -Well controlled with risperidone 0.5mg  in AM and 1mg  in PM -Modified barium swallow was done, recommended dysphagia 1 solids, oral care BID, SNF at discharge -Resting comfortably today, -Plan is SNF placement -Continue ASA 81 mg -Oral care protocol  Transient loss of consciousness:Likely 2/2 cerebral hypoperfusion due to carotid artery atherosclerosis and orthostatic hypotension.  -OT/PT evaluated and recommended SNF placement -Continue ASA 81 mg -CSW to assist with SNF placement -Up in chair with meals  Hypothyroidism: Patient was found to have elevated TSH on admission.  -T4 0.62  -Continue Armour 20mg  PO  FEN:No fluids, replete lytes prn,dysphagia VTE ppx: Lovenox  Code Status: DNR  Dispo: Anticipated  discharge pending SNF placement.  Asencion Noble, MD 03/13/2018, 6:26 AM Pager: [phone]

## 2018-03-13 NOTE — Progress Notes (Signed)
Physical Therapy Treatment Patient Details Name: Ronald English MRN: [id number] DOB: 11/25/1925 Today's Date: 03/13/2018    History of Present Illness This is a 84 year old male with a history of hypothyroidism, HLD, HTN, CVA (2015) and carotid stenosis presenting with altered mental status.     PT Comments    Patient received in bed, A&Ox1 and requiring multiple cues to participate in PT session today. He requires ModAx2 for functional bed mobility due to difficulty sequencing and confusion, MinAx2 with RW for functional transfers and gait approximately 76f today. Patient with difficulty in using RW correctly and therapist provided heavy MinA for patient to remain within boundaries of device and for safe use of device today. Max cues provided for navigation in hallway/in and out of room. He was left up in the chair with fall mats in place, chair alarm activated, and all needs otherwise met this morning.    Follow Up Recommendations  SNF;Supervision/Assistance - 24 hour     Equipment Recommendations  Other (comment)(defer to next venue )    Recommendations for Other Services       Precautions / Restrictions Precautions Precautions: Fall Restrictions Weight Bearing Restrictions: No    Mobility  Bed Mobility Overal bed mobility: Needs Assistance Bed Mobility: Supine to Sit     Supine to sit: Mod assist;+2 for safety/equipment     General bed mobility comments: patient very confused and unable to follow cues for bed mobiltiy, required ModAx2 to come to EOB   Transfers Overall transfer level: Needs assistance Equipment used: Rolling walker (2 wheeled) Transfers: Sit to/from Stand Sit to Stand: Min assist;+2 safety/equipment         General transfer comment: +2 assist for safety during transfer, patient very confused and requires frequent cues  Ambulation/Gait Ambulation/Gait assistance: Min assist Gait Distance (Feet): 40 Feet Assistive device: Rolling walker (2  wheeled) Gait Pattern/deviations: Step-through pattern;Decreased step length - right;Decreased step length - left;Decreased stride length;Decreased dorsiflexion - right;Decreased dorsiflexion - left;Drifts right/left;Trunk flexed;Narrow base of support     General Gait Details: MinAx2 for safe use of RW and for balance as patient is unsteady and has poor safety awareness due to high levels of confusion    Stairs             Wheelchair Mobility    Modified Rankin (Stroke Patients Only)       Balance Overall balance assessment: Needs assistance   Sitting balance-Leahy Scale: Fair       Standing balance-Leahy Scale: Poor                              Cognition Arousal/Alertness: Awake/alert Behavior During Therapy: Restless;Impulsive Overall Cognitive Status: Impaired/Different from baseline Area of Impairment: Orientation;Attention;Memory;Awareness;Safety/judgement;Following commands;Problem solving                 Orientation Level: Disoriented to;Place;Time;Situation Current Attention Level: Focused Memory: Decreased recall of precautions;Decreased short-term memory Following Commands: Follows one step commands inconsistently Safety/Judgement: Decreased awareness of safety;Decreased awareness of deficits Awareness: Intellectual Problem Solving: Slow processing;Decreased initiation;Difficulty sequencing;Requires verbal cues;Requires tactile cues General Comments: Not oriented. Mumbling nonsensical speech. Does not answer questions appropriately and reports he is at home right now      Exercises      General Comments        Pertinent Vitals/Pain Pain Assessment: Faces Pain Score: 0-No pain Faces Pain Scale: No hurt    Home Living  Prior Function            PT Goals (current goals can now be found in the care plan section) Acute Rehab PT Goals Patient Stated Goal: per family for pt to get back to  normal PT Goal Formulation: With patient/family Time For Goal Achievement: 03/21/18 Potential to Achieve Goals: Good Progress towards PT goals: Progressing toward goals(slowly, limited by confusion )    Frequency    Min 2X/week      PT Plan Current plan remains appropriate    Co-evaluation              AM-PAC PT "6 Clicks" Daily Activity  Outcome Measure  Difficulty turning over in bed (including adjusting bedclothes, sheets and blankets)?: Unable Difficulty moving from lying on back to sitting on the side of the bed? : Unable Difficulty sitting down on and standing up from a chair with arms (e.g., wheelchair, bedside commode, etc,.)?: Unable Help needed moving to and from a bed to chair (including a wheelchair)?: A Lot Help needed walking in hospital room?: A Lot Help needed climbing 3-5 steps with a railing? : Total 6 Click Score: 8    End of Session Equipment Utilized During Treatment: Gait belt Activity Tolerance: Patient tolerated treatment well Patient left: in chair;with chair alarm set;with call bell/phone within reach   PT Visit Diagnosis: Unsteadiness on feet (R26.81);Other abnormalities of gait and mobility (R26.89);Difficulty in walking, not elsewhere classified (R26.2)     Time: 4718-5501 PT Time Calculation (min) (ACUTE ONLY): 15 min  Charges:  $Gait Training: 8-22 mins                     Deniece Ree PT, DPT, CBIS  Supplemental Physical Therapist [place]   Pager [phone]

## 2018-03-13 NOTE — Progress Notes (Signed)
Ronald English has insurance approval to admit patient. MD aware.  [name] LCSW [phone]

## 2018-03-13 NOTE — Progress Notes (Signed)
Spoke with [facility] and they stated the pt. would remain here at [facility] for the night per [facility], LCSW and the patient would be discharged in the morning. Patient's son at bedside and informed of the plan.

## 2018-03-13 NOTE — Progress Notes (Signed)
  Speech Language Pathology Treatment: Dysphagia  Patient Details Name: Ronald English MRN: [id number] DOB: [birth year] Today's Date: 03/13/2018 Time: 4920-1007 SLP Time Calculation (min) (ACUTE ONLY): 11 min  Assessment / Plan / Recommendation Clinical Impression  SLP provided skilled observation as pt was completing his lunch meal. Intermittent, delayed throat clearing was observed, as was one instance of delayed coughing similar to what was observed on MBS when barium appeared to sit in his esophagus. Mod cues were given for small sips and alternating solids/liquids. Pt seems very pleased with being able to eat and drink again. Recommend continuing with current diet and precautions to facilitate safety with swallowing. No family was present for education this afternoon.   HPI HPI: This is a 84 year old male with a history of hypothyroidism, HLD, HTN, CVA (2015) and carotid stenosis presenting with altered mental status. Patient noted to have minimal PO intake with increasing difficulty.       SLP Plan  Continue with current plan of care       Recommendations  Diet recommendations: Dysphagia 1 (puree);Nectar-thick liquid Liquids provided via: Cup;No straw Medication Administration: Crushed with puree Supervision: Staff to assist with self feeding;Full supervision/cueing for compensatory strategies Compensations: Minimize environmental distractions;Slow rate;Small sips/bites;Follow solids with liquid Postural Changes and/or Swallow Maneuvers: Seated upright 90 degrees;Upright 30-60 min after meal                Oral Care Recommendations: Oral care BID Follow up Recommendations: Skilled Nursing facility SLP Visit Diagnosis: Dysphagia, oropharyngeal phase (R13.12) Plan: Continue with current plan of care       GO                Germain Osgood 03/13/2018, 3:40 PM  Germain Osgood, M.A. CCC-SLP [phone]

## 2018-03-13 NOTE — Progress Notes (Signed)
Internal Medicine Attending:   I saw and examined the patient. I reviewed the resident's note and I agree with the resident's findings and plan as documented in the resident's note.  Patient is doing well today, up in the chair, acute metabolic encephalopathy is improving, no further agitation or safety concerns. Transitioned to dysphagia diet, there is still risk of aspiration here, but overall I think it will be better for his quality of life as he seems to enjoy the act of eating. Planning for transfer to SNF tomorrow with outpatient palliative care followup.

## 2018-03-14 DIAGNOSIS — Z885 Allergy status to narcotic agent status: Secondary | ICD-10-CM

## 2018-03-14 DIAGNOSIS — Z7982 Long term (current) use of aspirin: Secondary | ICD-10-CM

## 2018-03-14 NOTE — Progress Notes (Signed)
Pt BP 186/79 HR 73 pt asymptomatic- sleeping in bed. RN notified night coverage MD. No interventions ordered, will just continue to monitor pt and anticipate discharge to SNF this morning.

## 2018-03-14 NOTE — Clinical Social Work Placement (Signed)
   CLINICAL SOCIAL WORK PLACEMENT  NOTE  Date:  03/14/2018  Patient Details  Name: Ronald English MRN: [id number] Date of Birth: 07-28-1925  Clinical Social Work is seeking post-discharge placement for this patient at the [place] level of care (*CSW will initial, date and re-position this form in  chart as items are completed):  Yes   Patient/family provided with [place] Work Department's list of facilities offering this level of care within the geographic area requested by the patient (or if unable, by the patient's family).  Yes   Patient/family informed of their freedom to choose among providers that offer the needed level of care, that participate in Medicare, Medicaid or managed care program needed by the patient, have an available bed and are willing to accept the patient.  Yes   Patient/family informed of [facility]'s ownership interest in [facility] and [facility], as well as of the fact that they are under no obligation to receive care at these facilities.  PASRR submitted to EDS on 03/08/18     PASRR number received on 03/08/18     Existing PASRR number confirmed on       FL2 transmitted to all facilities in geographic area requested by pt/family on 03/08/18     FL2 transmitted to all facilities within larger geographic area on       Patient informed that his/her managed care company has contracts with or will negotiate with certain facilities, including the following:        Yes   Patient/family informed of bed offers received.  Patient chooses bed at [facility]     Physician recommends and patient chooses bed at      Patient to be transferred to [facility] on 03/14/18.  Patient to be transferred to facility by [facility]AR     Patient family notified on 03/14/18 of transfer.  Name of family member notified:  Kem [place]s, and Son [facility]     PHYSICIAN       Additional Comment:     _______________________________________________ Benard Halsted, [place] 03/14/2018, 10:42 AM

## 2018-03-14 NOTE — Progress Notes (Signed)
Pt prepared for d/c to SNF. Skin intact except as charted in most recent assessments. Vitals are stable. Report called to receiving facility. Pt to be transported by ambulance service. 

## 2018-03-14 NOTE — Progress Notes (Signed)
   Subjective: Patient was resting comfortably, no acute events overnight. No family at bedside. He was more alert and interactive today, was able to state his name and where he was. He was very comfortable. We discussed that he would be going to SNF today and he is in agreement.   Objective:  Vital signs in last 24 hours: Vitals:   03/13/18 0556 03/13/18 1303 03/13/18 2137 03/14/18 0601  BP: (!) 149/60 (!) 161/85 (!) 145/80 (!) 186/79  Pulse: 70 94 90 73  Resp: 18 18 18 18   Temp:  98.1 F (36.7 C) 98.1 F (36.7 C) 97.8 F (36.6 C)  TempSrc:  Oral Oral   SpO2: 99% 96% 97% 96%  Weight:      Height:        General: Frail elderly male, no acute distress Cardiac: RRR, normal S1, S2, no murmurs, rubs or gallops  Pulmonary: Lungs CTA bilaterally, no wheezing, rhonchi or rales  Abdomen: Soft, non-tender, +bowel sounds, no guarding or masses noted  Psychiatry: Normal mood and affect     Assessment/Plan:  Active Problems:   Aortic atherosclerosis (HCC)   Altered mental status   TIA (transient ischemic attack)   Palliative care by specialist   Advance care planning   Goals of care, counseling/discussion   Dysphagia  This is a 84 year old male with a history of hypothyroidism, HLD, HTN, carotid stenosispresenting with altered mental statusand transient loss of consciousness.   Vascular dementia Delirium -Well controlled with risperidone 0.5 mg in AM, 1 mg in PM -Plan for SNF today -He is more interactive today, will continue with his dysphagia diet at SNF -Continue ASA and risperidone at discharge  Hypothyroidism: Patient was found to have elevated TSH on admission.  -T4 0.62  -Continue Armour 20mg  PO  FEN: No fluids, replete lytes prn, dysphagia diet VTE ppx: Lovenox  Code Status:DNR    Dispo: Anticipated discharge in approximately today  Asencion Noble, MD 03/14/2018, 10:28 AM Pager: [phone]

## 2018-03-14 NOTE — Progress Notes (Addendum)
Patient will DC to: [facility] Anticipated DC date: 03/14/18 Family notified: Spouse and [name]  Transport by: Corey Harold 2:30pm   Per MD patient ready for DC to [facility]. RN, patient, patient's family, and facility notified of DC. Discharge Summary sent to facility. RN given number for report [phone]). DC packet on chart. Ambulance transport requested for patient.   CSW signing off.  Cedric Fishman, LCSW Clinical Social Worker [phone]

## 2018-03-14 NOTE — Care Management Important Message (Signed)
Important Message  Patient Details  Name: Ronald English MRN: [id number] Date of Birth: [birth year]   Medicare Important Message Given:  Yes    [name] Montine Circle 03/14/2018, 3:41 PM

## 2018-03-16 ENCOUNTER — Non-Acute Institutional Stay: Payer: Medicare Other | Admitting: Hospice and Palliative Medicine

## 2018-03-16 DIAGNOSIS — Z515 Encounter for palliative care: Secondary | ICD-10-CM

## 2018-03-16 NOTE — Progress Notes (Signed)
PALLIATIVE CARE CONSULT VISIT   PATIENT NAME: Ronald English DOB: 1925/08/30 MRN: [id number]  PRIMARY CARE PROVIDER:   Levin Erp, MD  REFERRING PROVIDER:  Levin Erp, MD [address] [place], [place] [address]  RESPONSIBLE PARTY:   Son/Wife  ASSESSMENT:     Patient is pleasantly confused. Feeding himself lunch. No family present. Patient has no acute complaints. Nursing staff deny concerns. Patient is working with PT. Unclear what was his previous functional baseline.   MOST form in chart details desire for DNAR/Comfort measures only, IVF and antibiotics if indicated, and no feeding tube.   I tried calling patient's son without success.   RECOMMENDATIONS and PLAN:  1. Continue supportive care.  2. Will try to reach family to discuss goals.   I spent 30 minutes providing this consultation,  from 1200 to 1230. More than 50% of the time in this consultation was spent coordinating communication.   HISTORY OF PRESENT ILLNESS:  Ronald English is a 84 y.o. year old male with multiple medical problems including CVA, hypothyroidism, HTN, HLD, and PVD, who was hospitalized 03/06/18 to 03/14/18 with syncope thought secondary to moderate to severe B. carotid artery stenosis and orthostatic hypotension. He was seen by vascular surgery but no intervention was recommended at the present time. He was also seen in consultation by palliative care with recommendation for [place] to follow at rehab.    CODE STATUS: DNR  PPS: 40% HOSPICE ELIGIBILITY/DIAGNOSIS: TBD  PAST MEDICAL HISTORY:  Past Medical History:  Diagnosis Date  . Anteroseptal myocardial infarction [facility]) 1993   2006 left main normal, right coronary artery patent stent, circumflex 30% marginal stenosis, LAD distal 90% stenosis. There is a patent proximal stent. He has apparently had Cypher stenting to the distal segment.  Marland Kitchen CAD (coronary artery disease)   . Carotid stenosis   . Hypercholesterolemia   . Hypertension     . Hypothyroidism   . Pneumonia    "once several years ago" (01/09/2013)  . Skin cancer of face    "couple times; off my forehead" (01/09/2013)  . Skull fracture ([place])    "when I was in the service" (01/09/2013)    SOCIAL HX:  Social History   Tobacco Use  . Smoking status: Former Smoker    Packs/day: 1.50    Years: 20.00    Pack years: 30.00    Types: Cigarettes    Last attempt to quit: 07/19/1968    Years since quitting: 49.6  . Smokeless tobacco: Former Systems developer    Types: Chew    Quit date: 07/19/2012  Substance Use Topics  . Alcohol use: Yes    Comment: 01/09/2013 "used to drink some when I was young; haven't drank for 2-3 yrs"    ALLERGIES:  Allergies  Allergen Reactions  . Codeine Itching     PERTINENT MEDICATIONS:  Outpatient Encounter Medications as of 03/16/2018  Medication Sig  . aspirin 81 MG chewable tablet Chew 1 tablet (81 mg total) by mouth daily.  Marland Kitchen atorvastatin (LIPITOR) 40 MG tablet Take 1 tablet (40 mg total) by mouth daily at 6 PM.  . Maltodextrin-Xanthan Gum (RESOURCE THICKENUP CLEAR) POWD Take 120 g by mouth as needed.  . nicotine (NICODERM CQ - DOSED IN MG/24 HR) 7 mg/24hr patch Place 1 patch (7 mg total) onto the skin daily.  . risperiDONE (RISPERDAL M-TABS) 0.5 MG disintegrating tablet Take 1 tablet (0.5 mg total) by mouth daily.  . risperiDONE (RISPERDAL M-TABS) 1 MG  disintegrating tablet Take 1 tablet (1 mg total) by mouth at bedtime.  Marland Kitchen thyroid (ARMOUR) 30 MG tablet Take 1 tablet (30 mg total) by mouth daily before breakfast.   No facility-administered encounter medications on file as of 03/16/2018.     PHYSICAL EXAM:   General: NAD, frail appearing, thin, in wheelchair Cardiovascular: regular rate and rhythm Pulmonary: clear ant fields Abdomen: soft, nontender, + bowel sounds GU: no suprapubic tenderness Extremities: no edema, no joint deformities Skin: no rashes Neurological: Weakness, oriented to person  Irean Hong, NP

## 2018-04-10 ENCOUNTER — Inpatient Hospital Stay: Payer: Self-pay | Admitting: Internal Medicine

## 2018-08-01 ENCOUNTER — Emergency Department (HOSPITAL_COMMUNITY): Payer: Medicare Other

## 2018-08-01 ENCOUNTER — Inpatient Hospital Stay (HOSPITAL_COMMUNITY)
Admission: EM | Admit: 2018-08-01 | Discharge: 2018-08-09 | DRG: 177 | Disposition: A | Discharge status: Hospice | Payer: Medicare Other | Attending: Internal Medicine | Admitting: Internal Medicine

## 2018-08-01 ENCOUNTER — Encounter (HOSPITAL_COMMUNITY): Payer: Self-pay | Admitting: Student

## 2018-08-01 ENCOUNTER — Other Ambulatory Visit: Payer: Self-pay

## 2018-08-01 DIAGNOSIS — R64 Cachexia: Secondary | ICD-10-CM | POA: Diagnosis present

## 2018-08-01 DIAGNOSIS — Z751 Person awaiting admission to adequate facility elsewhere: Secondary | ICD-10-CM

## 2018-08-01 DIAGNOSIS — Z8639 Personal history of other endocrine, nutritional and metabolic disease: Secondary | ICD-10-CM

## 2018-08-01 DIAGNOSIS — R1310 Dysphagia, unspecified: Secondary | ICD-10-CM

## 2018-08-01 DIAGNOSIS — Z85828 Personal history of other malignant neoplasm of skin: Secondary | ICD-10-CM

## 2018-08-01 DIAGNOSIS — Z79899 Other long term (current) drug therapy: Secondary | ICD-10-CM

## 2018-08-01 DIAGNOSIS — J690 Pneumonitis due to inhalation of food and vomit: Secondary | ICD-10-CM | POA: Diagnosis not present

## 2018-08-01 DIAGNOSIS — F0391 Unspecified dementia with behavioral disturbance: Secondary | ICD-10-CM | POA: Diagnosis not present

## 2018-08-01 DIAGNOSIS — Z66 Do not resuscitate: Secondary | ICD-10-CM | POA: Diagnosis present

## 2018-08-01 DIAGNOSIS — Z955 Presence of coronary angioplasty implant and graft: Secondary | ICD-10-CM

## 2018-08-01 DIAGNOSIS — E43 Unspecified severe protein-calorie malnutrition: Secondary | ICD-10-CM | POA: Diagnosis present

## 2018-08-01 DIAGNOSIS — J181 Lobar pneumonia, unspecified organism: Secondary | ICD-10-CM | POA: Diagnosis not present

## 2018-08-01 DIAGNOSIS — F03918 Unspecified dementia, unspecified severity, with other behavioral disturbance: Secondary | ICD-10-CM | POA: Diagnosis present

## 2018-08-01 DIAGNOSIS — E876 Hypokalemia: Secondary | ICD-10-CM | POA: Diagnosis present

## 2018-08-01 DIAGNOSIS — N179 Acute kidney failure, unspecified: Secondary | ICD-10-CM

## 2018-08-01 DIAGNOSIS — E039 Hypothyroidism, unspecified: Secondary | ICD-10-CM | POA: Diagnosis present

## 2018-08-01 DIAGNOSIS — E860 Dehydration: Secondary | ICD-10-CM | POA: Diagnosis present

## 2018-08-01 DIAGNOSIS — F0151 Vascular dementia with behavioral disturbance: Secondary | ICD-10-CM | POA: Diagnosis present

## 2018-08-01 DIAGNOSIS — Z9842 Cataract extraction status, left eye: Secondary | ICD-10-CM

## 2018-08-01 DIAGNOSIS — Z7982 Long term (current) use of aspirin: Secondary | ICD-10-CM

## 2018-08-01 DIAGNOSIS — Z8673 Personal history of transient ischemic attack (TIA), and cerebral infarction without residual deficits: Secondary | ICD-10-CM

## 2018-08-01 DIAGNOSIS — Z885 Allergy status to narcotic agent status: Secondary | ICD-10-CM

## 2018-08-01 DIAGNOSIS — Z961 Presence of intraocular lens: Secondary | ICD-10-CM | POA: Diagnosis present

## 2018-08-01 DIAGNOSIS — Z8701 Personal history of pneumonia (recurrent): Secondary | ICD-10-CM

## 2018-08-01 DIAGNOSIS — Z7189 Other specified counseling: Secondary | ICD-10-CM

## 2018-08-01 DIAGNOSIS — Z720 Tobacco use: Secondary | ICD-10-CM

## 2018-08-01 DIAGNOSIS — Z9181 History of falling: Secondary | ICD-10-CM

## 2018-08-01 DIAGNOSIS — Z9111 Patient's noncompliance with dietary regimen: Secondary | ICD-10-CM

## 2018-08-01 DIAGNOSIS — I252 Old myocardial infarction: Secondary | ICD-10-CM

## 2018-08-01 DIAGNOSIS — N390 Urinary tract infection, site not specified: Secondary | ICD-10-CM | POA: Diagnosis present

## 2018-08-01 DIAGNOSIS — Z79890 Hormone replacement therapy (postmenopausal): Secondary | ICD-10-CM

## 2018-08-01 DIAGNOSIS — I2510 Atherosclerotic heart disease of native coronary artery without angina pectoris: Secondary | ICD-10-CM | POA: Diagnosis not present

## 2018-08-01 DIAGNOSIS — Z515 Encounter for palliative care: Secondary | ICD-10-CM | POA: Diagnosis present

## 2018-08-01 DIAGNOSIS — Z9841 Cataract extraction status, right eye: Secondary | ICD-10-CM

## 2018-08-01 DIAGNOSIS — J189 Pneumonia, unspecified organism: Secondary | ICD-10-CM | POA: Diagnosis present

## 2018-08-01 DIAGNOSIS — N183 Chronic kidney disease, stage 3 unspecified: Secondary | ICD-10-CM | POA: Diagnosis present

## 2018-08-01 DIAGNOSIS — Z681 Body mass index (BMI) 19 or less, adult: Secondary | ICD-10-CM

## 2018-08-01 DIAGNOSIS — I129 Hypertensive chronic kidney disease with stage 1 through stage 4 chronic kidney disease, or unspecified chronic kidney disease: Secondary | ICD-10-CM | POA: Diagnosis present

## 2018-08-01 DIAGNOSIS — E7800 Pure hypercholesterolemia, unspecified: Secondary | ICD-10-CM | POA: Diagnosis present

## 2018-08-01 DIAGNOSIS — Z823 Family history of stroke: Secondary | ICD-10-CM

## 2018-08-01 DIAGNOSIS — W19XXXA Unspecified fall, initial encounter: Secondary | ICD-10-CM

## 2018-08-01 LAB — CBC
HEMATOCRIT: 34.6 % — AB (ref 39.0–52.0)
Hemoglobin: 11.3 g/dL — ABNORMAL LOW (ref 13.0–17.0)
MCH: 29 pg (ref 26.0–34.0)
MCHC: 32.7 g/dL (ref 30.0–36.0)
MCV: 88.9 fL (ref 80.0–100.0)
NRBC: 0 % (ref 0.0–0.2)
Platelets: 311 10*3/uL (ref 150–400)
RBC: 3.89 MIL/uL — ABNORMAL LOW (ref 4.22–5.81)
RDW: 12.6 % (ref 11.5–15.5)
WBC: 14.7 10*3/uL — ABNORMAL HIGH (ref 4.0–10.5)

## 2018-08-01 LAB — COMPREHENSIVE METABOLIC PANEL
ALT: 15 U/L (ref 0–44)
AST: 22 U/L (ref 15–41)
Albumin: 3.2 g/dL — ABNORMAL LOW (ref 3.5–5.0)
Alkaline Phosphatase: 75 U/L (ref 38–126)
Anion gap: 14 (ref 5–15)
BILIRUBIN TOTAL: 1.4 mg/dL — AB (ref 0.3–1.2)
BUN: 40 mg/dL — AB (ref 8–23)
CO2: 23 mmol/L (ref 22–32)
CREATININE: 1.68 mg/dL — AB (ref 0.61–1.24)
Calcium: 11.7 mg/dL — ABNORMAL HIGH (ref 8.9–10.3)
Chloride: 102 mmol/L (ref 98–111)
GFR calc Af Amer: 41 mL/min — ABNORMAL LOW (ref >60)
GFR, EST NON AFRICAN AMERICAN: 35 mL/min — AB (ref >60)
Glucose, Bld: 132 mg/dL — ABNORMAL HIGH (ref 70–99)
POTASSIUM: 3.9 mmol/L (ref 3.5–5.1)
Sodium: 139 mmol/L (ref 135–145)
TOTAL PROTEIN: 7.7 g/dL (ref 6.5–8.1)

## 2018-08-01 MED ORDER — SODIUM CHLORIDE 0.9 % IV SOLN
500.0000 mg | INTRAVENOUS | Status: DC
  Administered 2018-08-02: 500 mg via INTRAVENOUS
  Filled 2018-08-01: qty 500

## 2018-08-01 MED ORDER — ONDANSETRON HCL 4 MG/2ML IJ SOLN: 4.0000 mg | Freq: Four times a day (QID) | INTRAMUSCULAR | Status: DC | PRN

## 2018-08-01 MED ORDER — THYROID 30 MG PO TABS
30.0000 mg | ORAL_TABLET | Freq: Every day | ORAL | Status: DC
Start: 2018-08-02 — End: 2018-08-03
  Administered 2018-08-02 – 2018-08-03 (×2): 30 mg via ORAL
  Filled 2018-08-01 (×2): qty 1

## 2018-08-01 MED ORDER — RISPERIDONE 1 MG PO TBDP
1.0000 mg | ORAL_TABLET | Freq: Every day | ORAL | Status: DC
  Administered 2018-08-01 – 2018-08-04 (×3): 1 mg via ORAL
  Filled 2018-08-01 (×4): qty 1

## 2018-08-01 MED ORDER — NICOTINE 7 MG/24HR TD PT24
7.0000 mg | MEDICATED_PATCH | Freq: Every day | TRANSDERMAL | Status: DC
  Administered 2018-08-02 – 2018-08-05 (×4): 7 mg via TRANSDERMAL
  Filled 2018-08-01 (×4): qty 1

## 2018-08-01 MED ORDER — ACETAMINOPHEN 325 MG PO TABS: 650.0000 mg | ORAL_TABLET | Freq: Four times a day (QID) | ORAL | Status: DC | PRN

## 2018-08-01 MED ORDER — RISPERIDONE 0.5 MG PO TBDP
0.5000 mg | ORAL_TABLET | Freq: Every day | ORAL | Status: DC
Start: 2018-08-02 — End: 2018-08-05
  Administered 2018-08-02 – 2018-08-05 (×4): 0.5 mg via ORAL
  Filled 2018-08-01 (×4): qty 1

## 2018-08-01 MED ORDER — ATORVASTATIN CALCIUM 40 MG PO TABS
40.0000 mg | ORAL_TABLET | Freq: Every day | ORAL | Status: DC
  Administered 2018-08-02: 40 mg via ORAL
  Filled 2018-08-01: qty 1

## 2018-08-01 MED ORDER — SODIUM CHLORIDE 0.9 % IV BOLUS
500.0000 mL | Freq: Once | INTRAVENOUS | Status: AC
  Administered 2018-08-01: 500 mL via INTRAVENOUS

## 2018-08-01 MED ORDER — METRONIDAZOLE IN NACL 5-0.79 MG/ML-% IV SOLN
500.0000 mg | Freq: Once | INTRAVENOUS | Status: AC
  Administered 2018-08-01: 500 mg via INTRAVENOUS
  Filled 2018-08-01: qty 100

## 2018-08-01 MED ORDER — SODIUM CHLORIDE 0.9 % IV SOLN
INTRAVENOUS | Status: AC
  Administered 2018-08-01: 23:00:00 via INTRAVENOUS

## 2018-08-01 MED ORDER — HEPARIN SODIUM (PORCINE) 5000 UNIT/ML IJ SOLN
5000.0000 [IU] | Freq: Three times a day (TID) | INTRAMUSCULAR | Status: DC
  Administered 2018-08-01 – 2018-08-05 (×11): 5000 [IU] via SUBCUTANEOUS
  Filled 2018-08-01 (×8): qty 1

## 2018-08-01 MED ORDER — ONDANSETRON HCL 4 MG PO TABS: 4.0000 mg | ORAL_TABLET | Freq: Four times a day (QID) | ORAL | Status: DC | PRN

## 2018-08-01 MED ORDER — SODIUM CHLORIDE 0.9 % IV SOLN
2.0000 g | INTRAVENOUS | Status: DC
  Administered 2018-08-01 – 2018-08-04 (×4): 2 g via INTRAVENOUS
  Filled 2018-08-01 (×5): qty 20

## 2018-08-01 MED ORDER — METRONIDAZOLE IN NACL 5-0.79 MG/ML-% IV SOLN
500.0000 mg | Freq: Three times a day (TID) | INTRAVENOUS | Status: DC
  Administered 2018-08-02 – 2018-08-03 (×4): 500 mg via INTRAVENOUS
  Filled 2018-08-01 (×4): qty 100

## 2018-08-01 MED ORDER — ASPIRIN 81 MG PO CHEW
81.0000 mg | CHEWABLE_TABLET | Freq: Every day | ORAL | Status: DC
  Administered 2018-08-02 – 2018-08-03 (×2): 81 mg via ORAL
  Filled 2018-08-01 (×2): qty 1

## 2018-08-01 MED ORDER — ACETAMINOPHEN 650 MG RE SUPP: 650.0000 mg | Freq: Four times a day (QID) | RECTAL | Status: DC | PRN
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
## 2018-08-01 NOTE — ED Triage Notes (Signed)
Pt brought in by ems for c/o fall ; wife states the patient was sitting on he edge of the bed and when she came back from the bathroom , she found the patient face down on the floor ; upon ems arrival pt was found with oral trauma;  Hx of dementia ;  Pt alert and oriented x 1 ;

## 2018-08-02 ENCOUNTER — Ambulatory Visit (INDEPENDENT_AMBULATORY_CARE_PROVIDER_SITE_OTHER): Payer: Self-pay | Admitting: Surgery

## 2018-08-02 DIAGNOSIS — F0151 Vascular dementia with behavioral disturbance: Secondary | ICD-10-CM | POA: Diagnosis present

## 2018-08-02 DIAGNOSIS — J690 Pneumonitis due to inhalation of food and vomit: Secondary | ICD-10-CM | POA: Diagnosis present

## 2018-08-02 DIAGNOSIS — Z681 Body mass index (BMI) 19 or less, adult: Secondary | ICD-10-CM | POA: Diagnosis not present

## 2018-08-02 DIAGNOSIS — Z9111 Patient's noncompliance with dietary regimen: Secondary | ICD-10-CM | POA: Diagnosis not present

## 2018-08-02 DIAGNOSIS — N390 Urinary tract infection, site not specified: Secondary | ICD-10-CM | POA: Diagnosis present

## 2018-08-02 DIAGNOSIS — N183 Chronic kidney disease, stage 3 (moderate): Secondary | ICD-10-CM | POA: Diagnosis present

## 2018-08-02 DIAGNOSIS — Z9181 History of falling: Secondary | ICD-10-CM | POA: Diagnosis not present

## 2018-08-02 DIAGNOSIS — Z7982 Long term (current) use of aspirin: Secondary | ICD-10-CM | POA: Diagnosis not present

## 2018-08-02 DIAGNOSIS — E7800 Pure hypercholesterolemia, unspecified: Secondary | ICD-10-CM | POA: Diagnosis present

## 2018-08-02 DIAGNOSIS — R1310 Dysphagia, unspecified: Secondary | ICD-10-CM | POA: Diagnosis present

## 2018-08-02 DIAGNOSIS — I2510 Atherosclerotic heart disease of native coronary artery without angina pectoris: Secondary | ICD-10-CM | POA: Diagnosis present

## 2018-08-02 DIAGNOSIS — E860 Dehydration: Secondary | ICD-10-CM | POA: Diagnosis present

## 2018-08-02 DIAGNOSIS — J189 Pneumonia, unspecified organism: Secondary | ICD-10-CM | POA: Diagnosis present

## 2018-08-02 DIAGNOSIS — Z79890 Hormone replacement therapy (postmenopausal): Secondary | ICD-10-CM | POA: Diagnosis not present

## 2018-08-02 DIAGNOSIS — E43 Unspecified severe protein-calorie malnutrition: Secondary | ICD-10-CM | POA: Diagnosis present

## 2018-08-02 DIAGNOSIS — I252 Old myocardial infarction: Secondary | ICD-10-CM | POA: Diagnosis not present

## 2018-08-02 DIAGNOSIS — I129 Hypertensive chronic kidney disease with stage 1 through stage 4 chronic kidney disease, or unspecified chronic kidney disease: Secondary | ICD-10-CM | POA: Diagnosis present

## 2018-08-02 DIAGNOSIS — Z66 Do not resuscitate: Secondary | ICD-10-CM | POA: Diagnosis present

## 2018-08-02 DIAGNOSIS — E876 Hypokalemia: Secondary | ICD-10-CM | POA: Diagnosis present

## 2018-08-02 DIAGNOSIS — R64 Cachexia: Secondary | ICD-10-CM | POA: Diagnosis present

## 2018-08-02 DIAGNOSIS — Z7189 Other specified counseling: Secondary | ICD-10-CM | POA: Diagnosis not present

## 2018-08-02 DIAGNOSIS — E039 Hypothyroidism, unspecified: Secondary | ICD-10-CM | POA: Diagnosis present

## 2018-08-02 DIAGNOSIS — Z720 Tobacco use: Secondary | ICD-10-CM | POA: Diagnosis not present

## 2018-08-02 DIAGNOSIS — N179 Acute kidney failure, unspecified: Secondary | ICD-10-CM | POA: Diagnosis present

## 2018-08-02 DIAGNOSIS — Z79899 Other long term (current) drug therapy: Secondary | ICD-10-CM | POA: Diagnosis not present

## 2018-08-02 DIAGNOSIS — F0391 Unspecified dementia with behavioral disturbance: Secondary | ICD-10-CM | POA: Diagnosis not present

## 2018-08-02 DIAGNOSIS — J181 Lobar pneumonia, unspecified organism: Secondary | ICD-10-CM | POA: Diagnosis not present

## 2018-08-02 DIAGNOSIS — Z515 Encounter for palliative care: Secondary | ICD-10-CM | POA: Diagnosis not present

## 2018-08-02 LAB — URINALYSIS, ROUTINE W REFLEX MICROSCOPIC
BILIRUBIN URINE: NEGATIVE
GLUCOSE, UA: NEGATIVE mg/dL
Ketones, ur: 20 mg/dL — AB
NITRITE: POSITIVE — AB
Protein, ur: 30 mg/dL — AB
SPECIFIC GRAVITY, URINE: 1.015 (ref 1.005–1.030)
WBC, UA: >50 WBC/hpf — ABNORMAL HIGH (ref 0–5)
pH: 7 (ref 5.0–8.0)

## 2018-08-02 LAB — CBC WITH DIFFERENTIAL/PLATELET
Abs Immature Granulocytes: 0.06 10*3/uL (ref 0.00–0.07)
Basophils Absolute: 0.1 10*3/uL (ref 0.0–0.1)
Basophils Relative: 1 %
Eosinophils Absolute: 0.1 10*3/uL (ref 0.0–0.5)
Eosinophils Relative: 1 %
HCT: 33.5 % — ABNORMAL LOW (ref 39.0–52.0)
Hemoglobin: 10.9 g/dL — ABNORMAL LOW (ref 13.0–17.0)
Immature Granulocytes: 1 %
Lymphocytes Relative: 10 %
Lymphs Abs: 1 10*3/uL (ref 0.7–4.0)
MCH: 29.2 pg (ref 26.0–34.0)
MCHC: 32.5 g/dL (ref 30.0–36.0)
MCV: 89.8 fL (ref 80.0–100.0)
MONO ABS: 0.7 10*3/uL (ref 0.1–1.0)
Monocytes Relative: 7 %
Neutro Abs: 8.8 10*3/uL — ABNORMAL HIGH (ref 1.7–7.7)
Neutrophils Relative %: 80 %
Platelets: 276 10*3/uL (ref 150–400)
RBC: 3.73 MIL/uL — AB (ref 4.22–5.81)
RDW: 12.5 % (ref 11.5–15.5)
WBC: 10.7 10*3/uL — ABNORMAL HIGH (ref 4.0–10.5)
nRBC: 0 % (ref 0.0–0.2)

## 2018-08-02 LAB — BASIC METABOLIC PANEL
Anion gap: 17 — ABNORMAL HIGH (ref 5–15)
BUN: 37 mg/dL — ABNORMAL HIGH (ref 8–23)
CO2: 19 mmol/L — AB (ref 22–32)
Calcium: 10.7 mg/dL — ABNORMAL HIGH (ref 8.9–10.3)
Chloride: 106 mmol/L (ref 98–111)
Creatinine, Ser: 1.44 mg/dL — ABNORMAL HIGH (ref 0.61–1.24)
GFR calc Af Amer: 49 mL/min — ABNORMAL LOW (ref >60)
GFR calc non Af Amer: 42 mL/min — ABNORMAL LOW (ref >60)
Glucose, Bld: 104 mg/dL — ABNORMAL HIGH (ref 70–99)
Potassium: 3.4 mmol/L — ABNORMAL LOW (ref 3.5–5.1)
Sodium: 142 mmol/L (ref 135–145)

## 2018-08-02 LAB — CREATININE, URINE, RANDOM: Creatinine, Urine: 79.15 mg/dL

## 2018-08-02 LAB — STREP PNEUMONIAE URINARY ANTIGEN: Strep Pneumo Urinary Antigen: NEGATIVE

## 2018-08-02 LAB — SODIUM, URINE, RANDOM: Sodium, Ur: 98 mmol/L

## 2018-08-02 MED ORDER — SODIUM CHLORIDE 0.9 % IV SOLN
INTRAVENOUS | Status: DC
  Administered 2018-08-02 – 2018-08-04 (×4): via INTRAVENOUS

## 2018-08-02 MED ORDER — POTASSIUM CHLORIDE 20 MEQ/15ML (10%) PO SOLN
40.0000 meq | Freq: Once | ORAL | Status: AC
  Administered 2018-08-02: 40 meq via ORAL
  Filled 2018-08-02: qty 30

## 2018-08-02 MED ORDER — POTASSIUM CHLORIDE 10 MEQ/100ML IV SOLN
10.0000 meq | INTRAVENOUS | Status: AC
  Administered 2018-08-02 (×4): 10 meq via INTRAVENOUS
  Filled 2018-08-02 (×3): qty 100

## 2018-08-02 MED ORDER — RESOURCE THICKENUP CLEAR PO POWD
ORAL | Status: DC | PRN
  Filled 2018-08-02: qty 125
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
## 2018-08-02 NOTE — Clinical Social Work Note (Signed)
Clinical Social Work Assessment  Patient Details  Name: Ronald English MRN: [id number] Date of Birth: 1926-05-31  Date of referral:  08/02/18               Reason for consult:  Facility Placement, Discharge Planning                Permission sought to share information with:  Family Supports Permission granted to share information::  No(pt only oriented to self)  Name::     Ronald English; [place]::     Relationship::  wife; son  Contact Information:  [phone]; [phone]  Housing/Transportation Living arrangements for the past 2 months:  Single Family Home Source of Information:  Adult Children Patient Interpreter Needed:  None Criminal Activity/Legal Involvement Pertinent to Current Situation/Hospitalization:  No - Comment as needed Significant Relationships:  Adult Children, Other Family Members, Spouse Lives with:  Spouse Do you feel safe going back to the place where you live?  Yes Need for family participation in patient care:  Yes (Comment)  Care giving concerns:  Pt from home with his wife. Has dementia and only oriented to self, wife also has documented dementia/memory loss. Pt fell at home, concern for assistance at home.    Social Worker assessment / plan:  CSW spoke with pt son Ronald English via telephone. Pt son confirms pt from home with wife. Pt son and daughter available for assistance. Pt son states his mother (pt wife) does have some documented memory loss but she still is making decisions for pt and ultimately will determine disposition.   Pt went to [facility] in August and stayed for "a few weeks." Pt son understands that pt must have 60 day wellness period for Medicare coverage to reset and he believes pt has ben home at least that long. Pt son requests that CSW f/u with him tomorrow to see what he and pt wife decided regarding disposition.  Pt son states understanding that pt is recommended for SNF again and requests [facility] or [place] [facility]  again.  Employment status:  Retired Nurse, adult PT Recommendations:  [place], [place] / Referral to community resources:  [address]  Patient/Family's Response to care:  Pt son amenable to speaking with CSW. He would like to see how pt progresses medically and discuss disposition with pt wife.  Patient/Family's Understanding of and Emotional Response to Diagnosis, Current Treatment, and Prognosis:  Pt son states understanding of diagnosis, current treatment and prognosis. Given that pt was not adherant to thickened liquids at home there is concern that perhaps pt family does not understand how recommendations relate to pt safety. Pt son emotionally appropriate via telephone, prefers pt discharge to SNF but will discuss disposition with pt wife.   Emotional Assessment Appearance:  Appears stated age Attitude/Demeanor/Rapport:  Unable to Assess Affect (typically observed):  Unable to Assess Orientation:  Oriented to Self, Fluctuating Orientation (Suspected and/or reported Sundowners) Alcohol / Substance use:  Not Applicable Psych involvement (Current and /or in the community):  No (Comment)  Discharge Needs  Concerns to be addressed:  Care Coordination, Discharge Planning Concerns Readmission within the last 30 days:  No Current discharge risk:  Cognitively Impaired, Dependent with Mobility Barriers to Discharge:  S[employer] broker, Continued Medical Work up   [employer], [place] 08/02/2018, 12:17 PM

## 2018-08-02 NOTE — Evaluation (Signed)
Physical Therapy Evaluation Patient Details Name: Ronald English MRN: [id number] DOB: 02/19/1926 Today's Date: 08/02/2018   History of Present Illness  85yo male presenting to the ED following a fall at home. Per family report in ED, he has recent history of CVA "a few months ago", went to SNF for rehab then home but at home has not been following dietary recommendations for thickened liquids. Had a fall on Christmas Eve as well with no injury. Imaging in ED clear of acute changes or fracture in cervical spine, head, and pelvis/hips. Found to have CAP. PMH hx MI, HTN, hx skull fracture, hx stent placement, dementia, CVA   Clinical Impression   Patient received in bed, asleep but easily woken and very pleasant although oriented only to self; per RN report, he lives with his wife who also has dementia, no other knowledge of PLOF/history known to PT/RN and family not present to provide this information. He required MaxA for supine to sit and to scoot to EOB, but once up able to maintain sitting balance with close S. Attempted sit to stand with RW, patient with no initiation for transfer. Then performed transfer with "bear hug" technique and MaxA, Mod VC for weight shifting and taking pivotal steps into chair/pregait activities. He was left up in the chair with chair alarm active and all needs met, RN observed part of session and aware of mobility status/PT recommendation for nursing to use stedy for transfer back to bed. He will continue to benefit from skilled PT services in the acute setting, recommend SNF unless family can provide safe/appropriate levels of 24/7 physical assistance and 24/7 S at home.     Follow Up Recommendations SNF    Equipment Recommendations  Other (comment)(defer to next venue )    Recommendations for Other Services       Precautions / Restrictions Precautions Precautions: Fall Restrictions Weight Bearing Restrictions: No      Mobility  Bed Mobility Overal bed  mobility: Needs Assistance Bed Mobility: Supine to Sit     Supine to sit: Max assist     General bed mobility comments: MaxA to come to sitting at EOB and to scoot forward at EOB, once up able to maintain sitting balance with close S   Transfers Overall transfer level: Needs assistance   Transfers: Sit to/from Stand Sit to Stand: Max assist;From elevated surface         General transfer comment: MaxA "bear hug" technique to pivot to chair, able to take pivotal steps; attempted transfers with RW but patient with no initiation for transfers with this method   Ambulation/Gait             General Gait Details: deferred due to safety concerns with +1 assist, able to take pivotal steps during transfer   Stairs            Wheelchair Mobility    Modified Rankin (Stroke Patients Only)       Balance Overall balance assessment: Needs assistance;History of Falls Sitting-balance support: Bilateral upper extremity supported;Feet supported Sitting balance-Leahy Scale: Good     Standing balance support: No upper extremity supported;During functional activity Standing balance-Leahy Scale: Poor Standing balance comment: reliant on external support to maintain balance, posterior lean                              Pertinent Vitals/Pain Pain Assessment: Faces Pain Score: 0-No pain Faces Pain Scale: No hurt Pain  Intervention(s): Monitored during session;Limited activity within patient's tolerance    Home Living Family/patient expects to be discharged to:: Unsure Living Arrangements: Spouse/significant other               Additional Comments: no family present to provide history/information, per RN he lives with his wife and she has dementia as well, no other history known to this PT or nursing staff. Patient A&O to self only, unable to provide history.     Prior Function Level of Independence: Independent         Comments: Community ambulatory per  prior charting      Hand Dominance   Dominant Hand: Right    Extremity/Trunk Assessment   Upper Extremity Assessment Upper Extremity Assessment: Generalized weakness    Lower Extremity Assessment Lower Extremity Assessment: Generalized weakness    Cervical / Trunk Assessment Cervical / Trunk Assessment: Kyphotic  Communication   Communication: HOH  Cognition Arousal/Alertness: Awake/alert Behavior During Therapy: WFL for tasks assessed/performed Overall Cognitive Status: History of cognitive impairments - at baseline                                 General Comments: history of dementia at baseline, oriented to self only per RN report and PT assessment; very poor safety awareness       General Comments      Exercises     Assessment/Plan    PT Assessment Patient needs continued PT services  PT Problem List Decreased strength;Decreased coordination;Decreased cognition;Decreased activity tolerance;Decreased knowledge of use of DME;Decreased balance;Decreased safety awareness;Decreased mobility;Decreased knowledge of precautions       PT Treatment Interventions DME instruction;Therapeutic exercise;Gait training;Balance training;Stair training;Neuromuscular re-education;Functional mobility training;Therapeutic activities;Patient/family education;Manual techniques    PT Goals (Current goals can be found in the Care Plan section)  Acute Rehab PT Goals PT Goal Formulation: Patient unable to participate in goal setting    Frequency Min 2X/week   Barriers to discharge Other (comment) unknown levels of assist/support at home     Co-evaluation               AM-PAC PT "6 Clicks" Mobility  Outcome Measure Help needed turning from your back to your side while in a flat bed without using bedrails?: A Lot Help needed moving from lying on your back to sitting on the side of a flat bed without using bedrails?: A Lot Help needed moving to and from a bed to a  chair (including a wheelchair)?: A Lot Help needed standing up from a chair using your arms (e.g., wheelchair or bedside chair)?: A Lot Help needed to walk in hospital room?: A Lot Help needed climbing 3-5 steps with a railing? : Total 6 Click Score: 11    End of Session Equipment Utilized During Treatment: Gait belt;Oxygen Activity Tolerance: Patient tolerated treatment well Patient left: in chair;with call bell/phone within reach;with chair alarm set Nurse Communication: Mobility status;Need for lift equipment(recommend stedy for transfer back to bed with nursing ) PT Visit Diagnosis: Unsteadiness on feet (R26.81);Muscle weakness (generalized) (M62.81);History of falling (Z91.81);Difficulty in walking, not elsewhere classified (R26.2);Other abnormalities of gait and mobility (R26.89)    Time: 3220-2542 PT Time Calculation (min) (ACUTE ONLY): 27 min   Charges:   PT Evaluation $PT Eval Moderate Complexity: 1 Mod PT Treatments $Gait Training: 8-22 mins        Deniece Ree PT, DPT, CBIS  Supplemental Physical Therapist [place]  Pager [phone] Acute Rehab Office [phone]

## 2018-08-02 NOTE — Progress Notes (Signed)
PROGRESS NOTE    Ronald English  GUY:[id number] DOB: 12-Jan-1926 DOA: 08/01/2018 PCP: Levin Erp, MD     Brief Narrative:  Ronald English is a 85 y.o. male with medical history significant for vascular dementia, CAD, history of CVA, hypothyroidism, and dysphasia, now presenting to the emergency department after a fall at home. He has reportedly been experiencing progressive generalized weakness and a general decline over the past few months since he had a CVA.  A dysphagia 1 diet with nectar thickened liquids had been recommended in August, but after returning home from a nursing facility, he has not been following any particular dietary recommendations and has not been thickening his liquids.  He consistently chokes while drinking and eating per family and frequently vomits while trying to eat and drink. Xhest x-ray is notable for COPD with patchy atelectasis versus pneumonia in the right middle lobe.  Patient was admitted for community-acquired pneumonia with aspiration concern.  New events last 24 hours / Subjective: No new events overnight.  Patient seems to be confused overall.   Assessment & Plan:   Principal Problem:   CAP (community acquired pneumonia) Active Problems:   CAD (coronary artery disease)   H/O: hypothyroidism   Dysphagia   Dementia with behavioral disturbance (HCC)   CKD (chronic kidney disease), stage III ([place])   Pneumonia   Community-acquired pneumonia and right middle lobe, concern for aspiration with noncompliance to dysphagia diet Continue Rocephin, azithromycin, Flagyl.  If clinically improved without fevers or leukocytosis, will narrow to Rocephin tomorrow   Dysphagia Continue dysphagia 1 diet, speech therapy consulted  UTI Unclear if patient with symptoms or not due to baseline dementia. Large leukocytes, positive nitrite, >50 WBC on UA. Urine culture ordered. On rocephin as above.   Fall Right hip xray negative for fracture. CT head/c-spine without  acute abnormality. PT recommending SNF.   AKI on CKD stage III Baseline creatinine 1.2. Improving. Encourage PO intake. IVF.   Dementia Continue risperidone  CAD Continue aspirin, lipitor   Hypokalemia Replace, trend   DVT prophylaxis: Subcutaneous heparin Code Status: DNR Family Communication: No family at bedside Disposition Plan: Pending clinical improvement, skilled nursing facility placement pending   Consultants:   None  Procedures:   None  Antimicrobials:  Anti-infectives (From admission, onward)   Start     Dose/Rate Route Frequency Ordered Stop   08/02/18 0400  metroNIDAZOLE (FLAGYL) IVPB 500 mg     500 mg 100 mL/hr over 60 Minutes Intravenous Every 8 hours 08/01/18 2009     08/01/18 1945  cefTRIAXone (ROCEPHIN) 2 g in sodium chloride 0.9 % 100 mL IVPB     2 g 200 mL/hr over 30 Minutes Intravenous Every 24 hours 08/01/18 1935     08/01/18 1945  azithromycin (ZITHROMAX) 500 mg in sodium chloride 0.9 % 250 mL IVPB     500 mg 250 mL/hr over 60 Minutes Intravenous Every 24 hours 08/01/18 1935     08/01/18 1945  metroNIDAZOLE (FLAGYL) IVPB 500 mg     500 mg 100 mL/hr over 60 Minutes Intravenous  Once 08/01/18 1935 08/01/18 2241       Objective: Vitals:   08/01/18 2100 08/01/18 2238 08/02/18 0433 08/02/18 1432  BP: (!) 154/99 (!) 162/82 (!) 158/90 (!) 135/58  Pulse: 73 86 90 86  Resp: 20 18 18 16   Temp:  98.3 F (36.8 C) 98.2 F (36.8 C)   TempSrc:  Oral Oral   SpO2:  Intake/Output Summary (Last 24 hours) at 08/02/2018 1446 Last data filed at 08/02/2018 0500 Gross per 24 hour  Intake 717.2 ml  Output 100 ml  Net 617.2 ml   There were no vitals filed for this visit.  Examination:  General exam: Appears calm and comfortable  Respiratory system: Without rhonchi, wheeze. Respiratory effort normal. Cardiovascular system: S1 & S2 heard, RRR. No JVD, murmurs, rubs, gallops or clicks. No pedal edema. Gastrointestinal system: Abdomen is  nondistended, soft and nontender. No organomegaly or masses felt. Normal bowel sounds heard. Central nervous system: Alert. No focal neurological deficits. Extremities: Symmetric Skin: No rashes, lesions or ulcers Psychiatry: Confused, unable to answer questions appropriately   Data Reviewed: I have personally reviewed following labs and imaging studies  CBC: Recent Labs  Lab 08/01/18 1733 08/02/18 0250  WBC 14.7* 10.7*  NEUTROABS  --  8.8*  HGB 11.3* 10.9*  HCT 34.6* 33.5*  MCV 88.9 89.8  PLT 311 637   Basic Metabolic Panel: Recent Labs  Lab 08/01/18 1733 08/02/18 0250  NA 139 142  K 3.9 3.4*  CL 102 106  CO2 23 19*  GLUCOSE 132* 104*  BUN 40* 37*  CREATININE 1.68* 1.44*  CALCIUM 11.7* 10.7*   GFR: CrCl cannot be calculated (Unknown ideal weight.). Liver Function Tests: Recent Labs  Lab 08/01/18 1733  AST 22  ALT 15  ALKPHOS 75  BILITOT 1.4*  PROT 7.7  ALBUMIN 3.2*   No results for input(s): LIPASE, AMYLASE in the last 168 hours. No results for input(s): AMMONIA in the last 168 hours. Coagulation Profile: No results for input(s): INR, PROTIME in the last 168 hours. Cardiac Enzymes: No results for input(s): CKTOTAL, CKMB, CKMBINDEX, TROPONINI in the last 168 hours. BNP (last 3 results) No results for input(s): PROBNP in the last 8760 hours. HbA1C: No results for input(s): HGBA1C in the last 72 hours. CBG: No results for input(s): GLUCAP in the last 168 hours. Lipid Profile: No results for input(s): CHOL, HDL, LDLCALC, TRIG, CHOLHDL, LDLDIRECT in the last 72 hours. Thyroid Function Tests: No results for input(s): TSH, T4TOTAL, FREET4, T3FREE, THYROIDAB in the last 72 hours. Anemia Panel: No results for input(s): VITAMINB12, FOLATE, FERRITIN, TIBC, IRON, RETICCTPCT in the last 72 hours. Sepsis Labs: No results for input(s): PROCALCITON, LATICACIDVEN in the last 168 hours.  No results found for this or any previous visit (from the past 240 hour(s)).      Radiology Studies: Dg Chest 2 View  Result Date: 08/01/2018 CLINICAL DATA:  [place] from his bed. Ex-smoker. EXAM: CHEST - 2 VIEW COMPARISON:  03/10/2018, 02/28/2014 and 02/26/2014. FINDINGS: The previously noted small calcified granuloma in the left mid lung zone is unchanged since 02/26/2014. The lungs remain hyperexpanded with interval mild patchy opacity in the right lower lung zone since 03/10/2018. This is in the right middle lobe on the lateral view. Multiple thoracic vertebral compression deformities have not changed significantly since 02/28/2014. No acute fracture or pneumothorax is seen. IMPRESSION: 1. Interval mild patchy atelectasis or pneumonia in the right middle lobe. Pulmonary contusion is less likely in the absence of rib fractures. 2. Stable changes of COPD and left lung calcified granuloma. Electronically Signed   By: Claudie Revering M.D.   On: 08/01/2018 19:26   Ct Head Wo Contrast  Result Date: 08/01/2018 CLINICAL DATA:  Recent trauma with headaches and neck pain, initial encounter EXAM: CT HEAD WITHOUT CONTRAST CT CERVICAL SPINE WITHOUT CONTRAST TECHNIQUE: Multidetector CT imaging of the head and cervical spine  was performed following the standard protocol without intravenous contrast. Multiplanar CT image reconstructions of the cervical spine were also generated. COMPARISON:  03/06/2018 FINDINGS: CT HEAD FINDINGS Brain: Chronic atrophic changes are noted. Bilateral encephalomalacia is noted within the temporal lobes as well as within the right occipital lobe stable from the prior exam. Scattered lacunar infarcts are noted bilaterally stable from the previous study. No new focal infarct is seen. No findings to suggest acute hemorrhage, acute infarction or space-occupying mass lesion is noted. Vascular: No hyperdense vessel or unexpected calcification. Skull: Normal. Negative for fracture or focal lesion. Sinuses/Orbits: No acute finding. Other: None. CT CERVICAL SPINE FINDINGS  Alignment: Within normal limits with the exception of mild anterolisthesis of C7 on T1 of a degenerative nature. Skull base and vertebrae: 7 cervical segments are well visualized. Vertebral body height is well maintained. Multilevel osteophytic changes as well as facet hypertrophic changes are seen. No acute fracture or acute facet abnormality is noted. Soft tissues and spinal canal: Heavy vascular calcifications are seen. No other soft tissue abnormality is noted Upper chest: Scarring is noted in the apices bilaterally. Emphysematous changes are noted. Other: None IMPRESSION: CT of the head: Chronic atrophic and ischemic changes stable from the previous exam. No acute intracranial abnormality is noted. CT of the cervical spine: Multilevel degenerative change stable from the previous study. No acute abnormality is noted. Electronically Signed   By: Inez Catalina M.D.   On: 08/01/2018 19:09   Ct Cervical Spine Wo Contrast  Result Date: 08/01/2018 CLINICAL DATA:  Recent trauma with headaches and neck pain, initial encounter EXAM: CT HEAD WITHOUT CONTRAST CT CERVICAL SPINE WITHOUT CONTRAST TECHNIQUE: Multidetector CT imaging of the head and cervical spine was performed following the standard protocol without intravenous contrast. Multiplanar CT image reconstructions of the cervical spine were also generated. COMPARISON:  03/06/2018 FINDINGS: CT HEAD FINDINGS Brain: Chronic atrophic changes are noted. Bilateral encephalomalacia is noted within the temporal lobes as well as within the right occipital lobe stable from the prior exam. Scattered lacunar infarcts are noted bilaterally stable from the previous study. No new focal infarct is seen. No findings to suggest acute hemorrhage, acute infarction or space-occupying mass lesion is noted. Vascular: No hyperdense vessel or unexpected calcification. Skull: Normal. Negative for fracture or focal lesion. Sinuses/Orbits: No acute finding. Other: None. CT CERVICAL SPINE  FINDINGS Alignment: Within normal limits with the exception of mild anterolisthesis of C7 on T1 of a degenerative nature. Skull base and vertebrae: 7 cervical segments are well visualized. Vertebral body height is well maintained. Multilevel osteophytic changes as well as facet hypertrophic changes are seen. No acute fracture or acute facet abnormality is noted. Soft tissues and spinal canal: Heavy vascular calcifications are seen. No other soft tissue abnormality is noted Upper chest: Scarring is noted in the apices bilaterally. Emphysematous changes are noted. Other: None IMPRESSION: CT of the head: Chronic atrophic and ischemic changes stable from the previous exam. No acute intracranial abnormality is noted. CT of the cervical spine: Multilevel degenerative change stable from the previous study. No acute abnormality is noted. Electronically Signed   By: Inez Catalina M.D.   On: 08/01/2018 19:09   Dg Hip Unilat With Pelvis 2-3 Views Right  Result Date: 08/01/2018 CLINICAL DATA:  Right hip pain following a fall from bed. EXAM: DG HIP (WITH OR WITHOUT PELVIS) 2-3V RIGHT COMPARISON:  None. FINDINGS: Diffuse osteopenia. No fracture or dislocation is seen. Lower lumbar spine degenerative changes. Atheromatous arterial calcifications. Mildly prominent stool.  IMPRESSION: No fracture or dislocation. Electronically Signed   By: Claudie Revering M.D.   On: 08/01/2018 19:27      Scheduled Meds: . aspirin  81 mg Oral Daily  . atorvastatin  40 mg Oral q1800  . heparin  5,000 Units Subcutaneous Q8H  . nicotine  7 mg Transdermal Daily  . risperiDONE  0.5 mg Oral Daily  . risperiDONE  1 mg Oral QHS  . thyroid  30 mg Oral QAC breakfast   Continuous Infusions: . sodium chloride    . azithromycin    . cefTRIAXone (ROCEPHIN)  IV Stopped (08/01/18 2148)  . metronidazole 500 mg (08/02/18 1314)     LOS: 0 days    Time spent: 40 minutes   Dessa Phi, DO [facility] [email or web address] 08/02/2018, 2:46 PM

## 2018-08-02 NOTE — NC FL2 (Signed)
[place] LEVEL OF CARE SCREENING TOOL     IDENTIFICATION  Patient Name: Ronald English Birthdate: 1926/03/01 Sex: male Admission Date (Current Location): 08/01/2018  [facility] and [place] Number:  Herbalist and Address:  The . [facility], [place] [address] [place], [place] [address]      Provider Number: [id number]  Attending Physician Name and Address:  Dessa Phi, DO  Relative Name and Phone Number:       Current Level of Care: Hospital Recommended Level of Care: [employer] Prior Approval Number:    Date Approved/Denied:   PASRR Number: [id number]  Discharge Plan: SNF    Current Diagnoses: Patient Active Problem List   Diagnosis Date Noted  . CAP (community acquired pneumonia) 08/01/2018  . Dementia with behavioral disturbance ([place]) 08/01/2018  . CKD (chronic kidney disease), stage III ([place]) 08/01/2018  . Dysphagia   . Palliative care by specialist   . Advance care planning   . Goals of care, counseling/discussion   . TIA (transient ischemic attack) 03/07/2018  . Aortic atherosclerosis ([place]) 03/06/2018  . Altered mental status 03/06/2018  . CVA (cerebral infarction) 03/01/2014  . Encephalopathy 02/28/2014  . H/O class II angina pectoris 11/16/2012  . CAD (coronary artery disease)   . H/O: hypothyroidism   . Carotid stenosis 01/18/2011  . Anteroseptal myocardial infarction ([place])   . Hypercholesterolemia     Orientation RESPIRATION BLADDER Height & Weight     Self(fluctuates due to dementia)  Normal Incontinent, External catheter Weight:   Height:     BEHAVIORAL SYMPTOMS/MOOD NEUROLOGICAL BOWEL NUTRITION STATUS      Continent Diet(see discharge summary)  AMBULATORY STATUS COMMUNICATION OF NEEDS Skin   Extensive Assist Verbally Other (Comment)(ecchymosis bilaterally on arms)                       Personal Care Assistance Level of Assistance  Bathing, Feeding, Dressing Bathing Assistance:  Maximum assistance Feeding assistance: Limited assistance Dressing Assistance: Maximum assistance     Functional Limitations Info  Sight, Hearing, Speech Sight Info: Adequate Hearing Info: Adequate Speech Info: Adequate    SPECIAL CARE FACTORS FREQUENCY  OT (By licensed OT), PT (By licensed PT)     PT Frequency: 5x week OT Frequency: 5x week             Contractures Contractures Info: Not present    Additional Factors Info  Code Status, Allergies, Psychotropic Code Status Info: DNR Allergies Info: CODEINE  Psychotropic Info: risperiDONE (RISPERDAL M-TABS) disintegrating tablet 0.5 mg daily PO; risperiDONE (RISPERDAL M-TABS) disintegrating tablet 1 mg daily at bedtime PO         Current Medications (08/02/2018):  This is the current hospital active medication list Current Facility-Administered Medications  Medication Dose Route Frequency Provider Last Rate Last Dose  . acetaminophen (TYLENOL) tablet 650 mg  650 mg Oral Q6H PRN Opyd, Ilene Qua, MD       Or  . acetaminophen (TYLENOL) suppository 650 mg  650 mg Rectal Q6H PRN Opyd, Ilene Qua, MD      . aspirin chewable tablet 81 mg  81 mg Oral Daily Opyd, Ilene Qua, MD   81 mg at 08/02/18 0954  . atorvastatin (LIPITOR) tablet 40 mg  40 mg Oral q1800 Opyd, Ilene Qua, MD      . azithromycin (ZITHROMAX) 500 mg in sodium chloride 0.9 % 250 mL IVPB  500 mg Intravenous Q24H Opyd, Ilene Qua, MD      .  cefTRIAXone (ROCEPHIN) 2 g in sodium chloride 0.9 % 100 mL IVPB  2 g Intravenous Q24H Opyd, Ilene Qua, MD   Stopped at 08/01/18 2148  . heparin injection 5,000 Units  5,000 Units Subcutaneous Q8H Vianne Bulls, MD   5,000 Units at 08/02/18 0427  . metroNIDAZOLE (FLAGYL) IVPB 500 mg  500 mg Intravenous Q8H Opyd, Ilene Qua, MD 100 mL/hr at 08/02/18 0425 500 mg at 08/02/18 0425  . nicotine (NICODERM CQ - dosed in mg/24 hr) patch 7 mg  7 mg Transdermal Daily Opyd, Ilene Qua, MD   7 mg at 08/02/18 0954  . ondansetron (ZOFRAN) tablet 4 mg   4 mg Oral Q6H PRN Opyd, Ilene Qua, MD       Or  . ondansetron (ZOFRAN) injection 4 mg  4 mg Intravenous Q6H PRN Opyd, Ilene Qua, MD      . potassium chloride 10 mEq in 100 mL IVPB  10 mEq Intravenous Q1 Hr x 4 Dessa Phi, DO 100 mL/hr at 08/02/18 1106 10 mEq at 08/02/18 1106  . RESOURCE THICKENUP CLEAR   Oral PRN Opyd, Ilene Qua, MD      . risperiDONE (RISPERDAL M-TABS) disintegrating tablet 0.5 mg  0.5 mg Oral Daily Opyd, Ilene Qua, MD   0.5 mg at 08/02/18 0955  . risperiDONE (RISPERDAL M-TABS) disintegrating tablet 1 mg  1 mg Oral QHS Opyd, Ilene Qua, MD   1 mg at 08/01/18 2249  . thyroid (ARMOUR) tablet 30 mg  30 mg Oral QAC breakfast Opyd, Ilene Qua, MD   30 mg at 08/02/18 3646     Discharge Medications: Please see discharge summary for a list of discharge medications.  Relevant Imaging Results:  Relevant Lab Results:   Additional Information SSN: [place], [place]

## 2018-08-03 LAB — BASIC METABOLIC PANEL
Anion gap: 13 (ref 5–15)
BUN: 31 mg/dL — ABNORMAL HIGH (ref 8–23)
CHLORIDE: 108 mmol/L (ref 98–111)
CO2: 22 mmol/L (ref 22–32)
Calcium: 11.1 mg/dL — ABNORMAL HIGH (ref 8.9–10.3)
Creatinine, Ser: 1.33 mg/dL — ABNORMAL HIGH (ref 0.61–1.24)
GFR calc Af Amer: 54 mL/min — ABNORMAL LOW (ref >60)
GFR calc non Af Amer: 47 mL/min — ABNORMAL LOW (ref >60)
Glucose, Bld: 103 mg/dL — ABNORMAL HIGH (ref 70–99)
Potassium: 3.6 mmol/L (ref 3.5–5.1)
Sodium: 143 mmol/L (ref 135–145)

## 2018-08-03 LAB — CBC
HEMATOCRIT: 34.9 % — AB (ref 39.0–52.0)
Hemoglobin: 11.4 g/dL — ABNORMAL LOW (ref 13.0–17.0)
MCH: 29.7 pg (ref 26.0–34.0)
MCHC: 32.7 g/dL (ref 30.0–36.0)
MCV: 90.9 fL (ref 80.0–100.0)
Platelets: 326 10*3/uL (ref 150–400)
RBC: 3.84 MIL/uL — ABNORMAL LOW (ref 4.22–5.81)
RDW: 12.9 % (ref 11.5–15.5)
WBC: 9.9 10*3/uL (ref 4.0–10.5)
nRBC: 0 % (ref 0.0–0.2)

## 2018-08-03 LAB — UREA NITROGEN, URINE: Urea Nitrogen, Ur: 739 mg/dL

## 2018-08-03 LAB — LEGIONELLA PNEUMOPHILA SEROGP 1 UR AG: L. pneumophila Serogp 1 Ur Ag: NEGATIVE

## 2018-08-03 NOTE — Progress Notes (Signed)
Called by RN, patient with aspiration episode with dysphagia 1 diet. Nurse tech was feeding patient and even with aspiration precaution, he had coughing episode and quickly desat to 73s. On exam, he is alert, SpO2 improved with increase in [employer] O2 to 4L. NPO for now. Continue goals of care discussion.  Dessa Phi, DO [facility] [email or web address] 08/03/2018, 1:11 PM

## 2018-08-03 NOTE — Social Work (Addendum)
CSW spoke with pt son Ronald English on phone.  He and his family are interested in [facility] for pt at discharge, [facility] will send referral. They do have several questions related to pt's diet, swallowing, and are interested in [place] conversation for pt.   Family request pt care team leave messages should pt son or wife not answer phone. Pt daughter in law Ronald English also can be reached at [phone] (office) or cell [phone] should that be needed.  CSW continuing to follow for support with disposition when medically appropriate.  Westley Hummer, MSW, [place] [facility] [phone]

## 2018-08-03 NOTE — Progress Notes (Signed)
PROGRESS NOTE    Ronald English  FGH:[id number] DOB: 1926/01/14 DOA: 08/01/2018 PCP: Levin Erp, MD     Brief Narrative:  Ronald English is a 85 y.o. male with medical history significant for vascular dementia, CAD, history of CVA, hypothyroidism, and dysphasia, now presenting to the emergency department after a fall at home. He has reportedly been experiencing progressive generalized weakness and a general decline over the past few months since he had a CVA.  A dysphagia 1 diet with nectar thickened liquids had been recommended in August, but after returning home from a nursing facility, he has not been following any particular dietary recommendations and has not been thickening his liquids.  He consistently chokes while drinking and eating per family and frequently vomits while trying to eat and drink. Xhest x-ray is notable for COPD with patchy atelectasis versus pneumonia in the right middle lobe.  Patient was admitted for community-acquired pneumonia with aspiration concern.  New events last 24 hours / Subjective: No acute events overnight.  Patient remains confused, dementia at baseline.  Spoke with son over the phone regarding patient's dementia, dysphagia, aspiration risk, pneumonia and frailty.  Son is interested in having a meeting and discussion with palliative care medicine.   Assessment & Plan:   Principal Problem:   CAP (community acquired pneumonia) Active Problems:   CAD (coronary artery disease)   H/O: hypothyroidism   Dysphagia   Dementia with behavioral disturbance (HCC)   CKD (chronic kidney disease), stage III ([place])   Pneumonia   Community-acquired pneumonia and right middle lobe, concern for aspiration with noncompliance to dysphagia diet Continue Rocephin  Dysphagia Continue dysphagia 1 diet, speech therapy consulted  UTI Unclear if patient with symptoms or not due to baseline dementia. Large leukocytes, positive nitrite, >50 WBC on UA. Urine culture  ordered. On rocephin as above.   Fall Right hip xray negative for fracture. CT head/c-spine without acute abnormality. PT recommending SNF.   AKI on CKD stage III Baseline creatinine 1.2. Improving. Encourage PO intake. IVF.   Dementia Continue risperidone  CAD Continue aspirin, lipitor   Goals of care Discussed with son. Palliative care medicine consulted.    DVT prophylaxis: Subcutaneous heparin Code Status: DNR Family Communication: No family at bedside, spoke with son over the phone  Disposition Plan: Pending clinical improvement, goals of care discussion    Consultants:   Palliative Care Medicine   Procedures:   None  Antimicrobials:  Anti-infectives (From admission, onward)   Start     Dose/Rate Route Frequency Ordered Stop   08/02/18 0400  metroNIDAZOLE (FLAGYL) IVPB 500 mg  Status:  Discontinued     500 mg 100 mL/hr over 60 Minutes Intravenous Every 8 hours 08/01/18 2009 08/03/18 0738   08/01/18 1945  cefTRIAXone (ROCEPHIN) 2 g in sodium chloride 0.9 % 100 mL IVPB     2 g 200 mL/hr over 30 Minutes Intravenous Every 24 hours 08/01/18 1935     08/01/18 1945  azithromycin (ZITHROMAX) 500 mg in sodium chloride 0.9 % 250 mL IVPB  Status:  Discontinued     500 mg 250 mL/hr over 60 Minutes Intravenous Every 24 hours 08/01/18 1935 08/03/18 0738   08/01/18 1945  metroNIDAZOLE (FLAGYL) IVPB 500 mg     500 mg 100 mL/hr over 60 Minutes Intravenous  Once 08/01/18 1935 08/01/18 2241       Objective: Vitals:   08/02/18 0433 08/02/18 1432 08/02/18 2003 08/03/18 0322  BP: (!) 158/90 (!) 135/58 (!) 152/58 Marland Kitchen)  158/82  Pulse: 90 86 67 90  Resp: 18 16 18 20   Temp: 98.2 F (36.8 C)  98.7 F (37.1 C) 98.5 F (36.9 C)  TempSrc: Oral  Oral Oral  SpO2:    93%    Intake/Output Summary (Last 24 hours) at 08/03/2018 1249 Last data filed at 08/03/2018 3295 Gross per 24 hour  Intake 820.5 ml  Output 400 ml  Net 420.5 ml   There were no vitals filed for this  visit.  Examination: General exam: Appears calm and comfortable  Respiratory system: Respiratory effort normal. Cardiovascular system: S1 & S2 heard, RRR. No JVD, murmurs, rubs, gallops or clicks. No pedal edema. Gastrointestinal system: Abdomen is nondistended, soft and nontender. No organomegaly or masses felt. Normal bowel sounds heard. Central nervous system: Alert, not oriented  Extremities: Symmetric  Skin: No rashes, lesions or ulcers   Data Reviewed: I have personally reviewed following labs and imaging studies  CBC: Recent Labs  Lab 08/01/18 1733 08/02/18 0250 08/03/18 0258  WBC 14.7* 10.7* 9.9  NEUTROABS  --  8.8*  --   HGB 11.3* 10.9* 11.4*  HCT 34.6* 33.5* 34.9*  MCV 88.9 89.8 90.9  PLT 311 276 188   Basic Metabolic Panel: Recent Labs  Lab 08/01/18 1733 08/02/18 0250 08/03/18 0258  NA 139 142 143  K 3.9 3.4* 3.6  CL 102 106 108  CO2 23 19* 22  GLUCOSE 132* 104* 103*  BUN 40* 37* 31*  CREATININE 1.68* 1.44* 1.33*  CALCIUM 11.7* 10.7* 11.1*   GFR: CrCl cannot be calculated (Unknown ideal weight.). Liver Function Tests: Recent Labs  Lab 08/01/18 1733  AST 22  ALT 15  ALKPHOS 75  BILITOT 1.4*  PROT 7.7  ALBUMIN 3.2*   No results for input(s): LIPASE, AMYLASE in the last 168 hours. No results for input(s): AMMONIA in the last 168 hours. Coagulation Profile: No results for input(s): INR, PROTIME in the last 168 hours. Cardiac Enzymes: No results for input(s): CKTOTAL, CKMB, CKMBINDEX, TROPONINI in the last 168 hours. BNP (last 3 results) No results for input(s): PROBNP in the last 8760 hours. HbA1C: No results for input(s): HGBA1C in the last 72 hours. CBG: No results for input(s): GLUCAP in the last 168 hours. Lipid Profile: No results for input(s): CHOL, HDL, LDLCALC, TRIG, CHOLHDL, LDLDIRECT in the last 72 hours. Thyroid Function Tests: No results for input(s): TSH, T4TOTAL, FREET4, T3FREE, THYROIDAB in the last 72 hours. Anemia  Panel: No results for input(s): VITAMINB12, FOLATE, FERRITIN, TIBC, IRON, RETICCTPCT in the last 72 hours. Sepsis Labs: No results for input(s): PROCALCITON, LATICACIDVEN in the last 168 hours.  No results found for this or any previous visit (from the past 240 hour(s)).     Radiology Studies: Dg Chest 2 View  Result Date: 08/01/2018 CLINICAL DATA:  [place] from his bed. Ex-smoker. EXAM: CHEST - 2 VIEW COMPARISON:  03/10/2018, 02/28/2014 and 02/26/2014. FINDINGS: The previously noted small calcified granuloma in the left mid lung zone is unchanged since 02/26/2014. The lungs remain hyperexpanded with interval mild patchy opacity in the right lower lung zone since 03/10/2018. This is in the right middle lobe on the lateral view. Multiple thoracic vertebral compression deformities have not changed significantly since 02/28/2014. No acute fracture or pneumothorax is seen. IMPRESSION: 1. Interval mild patchy atelectasis or pneumonia in the right middle lobe. Pulmonary contusion is less likely in the absence of rib fractures. 2. Stable changes of COPD and left lung calcified granuloma. Electronically Signed  By: Claudie Revering M.D.   On: 08/01/2018 19:26   Ct Head Wo Contrast  Result Date: 08/01/2018 CLINICAL DATA:  Recent trauma with headaches and neck pain, initial encounter EXAM: CT HEAD WITHOUT CONTRAST CT CERVICAL SPINE WITHOUT CONTRAST TECHNIQUE: Multidetector CT imaging of the head and cervical spine was performed following the standard protocol without intravenous contrast. Multiplanar CT image reconstructions of the cervical spine were also generated. COMPARISON:  03/06/2018 FINDINGS: CT HEAD FINDINGS Brain: Chronic atrophic changes are noted. Bilateral encephalomalacia is noted within the temporal lobes as well as within the right occipital lobe stable from the prior exam. Scattered lacunar infarcts are noted bilaterally stable from the previous study. No new focal infarct is seen. No findings to  suggest acute hemorrhage, acute infarction or space-occupying mass lesion is noted. Vascular: No hyperdense vessel or unexpected calcification. Skull: Normal. Negative for fracture or focal lesion. Sinuses/Orbits: No acute finding. Other: None. CT CERVICAL SPINE FINDINGS Alignment: Within normal limits with the exception of mild anterolisthesis of C7 on T1 of a degenerative nature. Skull base and vertebrae: 7 cervical segments are well visualized. Vertebral body height is well maintained. Multilevel osteophytic changes as well as facet hypertrophic changes are seen. No acute fracture or acute facet abnormality is noted. Soft tissues and spinal canal: Heavy vascular calcifications are seen. No other soft tissue abnormality is noted Upper chest: Scarring is noted in the apices bilaterally. Emphysematous changes are noted. Other: None IMPRESSION: CT of the head: Chronic atrophic and ischemic changes stable from the previous exam. No acute intracranial abnormality is noted. CT of the cervical spine: Multilevel degenerative change stable from the previous study. No acute abnormality is noted. Electronically Signed   By: Inez Catalina M.D.   On: 08/01/2018 19:09   Ct Cervical Spine Wo Contrast  Result Date: 08/01/2018 CLINICAL DATA:  Recent trauma with headaches and neck pain, initial encounter EXAM: CT HEAD WITHOUT CONTRAST CT CERVICAL SPINE WITHOUT CONTRAST TECHNIQUE: Multidetector CT imaging of the head and cervical spine was performed following the standard protocol without intravenous contrast. Multiplanar CT image reconstructions of the cervical spine were also generated. COMPARISON:  03/06/2018 FINDINGS: CT HEAD FINDINGS Brain: Chronic atrophic changes are noted. Bilateral encephalomalacia is noted within the temporal lobes as well as within the right occipital lobe stable from the prior exam. Scattered lacunar infarcts are noted bilaterally stable from the previous study. No new focal infarct is seen. No  findings to suggest acute hemorrhage, acute infarction or space-occupying mass lesion is noted. Vascular: No hyperdense vessel or unexpected calcification. Skull: Normal. Negative for fracture or focal lesion. Sinuses/Orbits: No acute finding. Other: None. CT CERVICAL SPINE FINDINGS Alignment: Within normal limits with the exception of mild anterolisthesis of C7 on T1 of a degenerative nature. Skull base and vertebrae: 7 cervical segments are well visualized. Vertebral body height is well maintained. Multilevel osteophytic changes as well as facet hypertrophic changes are seen. No acute fracture or acute facet abnormality is noted. Soft tissues and spinal canal: Heavy vascular calcifications are seen. No other soft tissue abnormality is noted Upper chest: Scarring is noted in the apices bilaterally. Emphysematous changes are noted. Other: None IMPRESSION: CT of the head: Chronic atrophic and ischemic changes stable from the previous exam. No acute intracranial abnormality is noted. CT of the cervical spine: Multilevel degenerative change stable from the previous study. No acute abnormality is noted. Electronically Signed   By: Inez Catalina M.D.   On: 08/01/2018 19:09   Dg Hip Unilat  With Pelvis 2-3 Views Right  Result Date: 08/01/2018 CLINICAL DATA:  Right hip pain following a fall from bed. EXAM: DG HIP (WITH OR WITHOUT PELVIS) 2-3V RIGHT COMPARISON:  None. FINDINGS: Diffuse osteopenia. No fracture or dislocation is seen. Lower lumbar spine degenerative changes. Atheromatous arterial calcifications. Mildly prominent stool. IMPRESSION: No fracture or dislocation. Electronically Signed   By: Claudie Revering M.D.   On: 08/01/2018 19:27      Scheduled Meds: . aspirin  81 mg Oral Daily  . atorvastatin  40 mg Oral q1800  . heparin  5,000 Units Subcutaneous Q8H  . nicotine  7 mg Transdermal Daily  . risperiDONE  0.5 mg Oral Daily  . risperiDONE  1 mg Oral QHS  . thyroid  30 mg Oral QAC breakfast    Continuous Infusions: . sodium chloride 75 mL/hr at 08/02/18 1916  . cefTRIAXone (ROCEPHIN)  IV 2 g (08/02/18 2122)     LOS: 1 day    Time spent: 35 minutes   Dessa Phi, DO [facility] [email or web address] 08/03/2018, 12:49 PM

## 2018-08-04 ENCOUNTER — Encounter (HOSPITAL_COMMUNITY): Payer: Self-pay | Admitting: Primary Care

## 2018-08-04 DIAGNOSIS — Z515 Encounter for palliative care: Secondary | ICD-10-CM

## 2018-08-04 DIAGNOSIS — Z7189 Other specified counseling: Secondary | ICD-10-CM

## 2018-08-04 LAB — BASIC METABOLIC PANEL
Anion gap: 13 (ref 5–15)
BUN: 23 mg/dL (ref 8–23)
CO2: 21 mmol/L — AB (ref 22–32)
Calcium: 10.7 mg/dL — ABNORMAL HIGH (ref 8.9–10.3)
Chloride: 108 mmol/L (ref 98–111)
Creatinine, Ser: 1.3 mg/dL — ABNORMAL HIGH (ref 0.61–1.24)
GFR calc Af Amer: 56 mL/min — ABNORMAL LOW (ref >60)
GFR calc non Af Amer: 48 mL/min — ABNORMAL LOW (ref >60)
GLUCOSE: 81 mg/dL (ref 70–99)
Potassium: 3.5 mmol/L (ref 3.5–5.1)
Sodium: 142 mmol/L (ref 135–145)

## 2018-08-04 NOTE — Progress Notes (Signed)
Physical Therapy Treatment Patient Details Name: Ronald English MRN: [id number] DOB: [birth year] Today's Date: 08/04/2018    History of Present Illness 85yo male presenting to the ED following a fall at home. Per family report in ED, he has recent history of CVA "a few months ago", went to SNF for rehab then home but at home has not been following dietary recommendations for thickened liquids. Had a fall on Christmas Eve as well with no injury. Imaging in ED clear of acute changes or fracture in cervical spine, head, and pelvis/hips. Found to have CAP. PMH hx MI, HTN, hx skull fracture, hx stent placement, dementia, CVA     PT Comments    Patient seen for mobility progression. Pt stood X 2 from EOB with +2 assist for safety. Unable to progress to gait training. Continue to progress as tolerated with anticipated d/c to SNF for further skilled PT services.     Follow Up Recommendations  SNF     Equipment Recommendations  Other (comment)(defer to next venue )    Recommendations for Other Services       Precautions / Restrictions Precautions Precautions: Fall Restrictions Weight Bearing Restrictions: No    Mobility  Bed Mobility Overal bed mobility: Needs Assistance Bed Mobility: Supine to Sit;Sit to Supine     Supine to sit: Max assist Sit to supine: Max assist   General bed mobility comments: assist for bilat LE to EOB and to elevate trunk into sitting and then to bring bilat LE into bed and guide trunk into side lying   Transfers Overall transfer level: Needs assistance Equipment used: Rolling walker (2 wheeled);2 person hand held assist Transfers: Sit to/from Stand Sit to Stand: Max assist;From elevated surface;Mod assist;+2 safety/equipment         General transfer comment: max A intial stand with +2 face to face and mod A +2 with RW; +2 more for safety  Ambulation/Gait             General Gait Details: worked on pre gait activities; pt unable to lift feet  from floor and advance but weight shifting with assist   Stairs             Wheelchair Mobility    Modified Rankin (Stroke Patients Only)       Balance Overall balance assessment: Needs assistance;History of Falls Sitting-balance support: Bilateral upper extremity supported;Feet supported Sitting balance-Leahy Scale: Good     Standing balance support: Bilateral upper extremity supported Standing balance-Leahy Scale: Poor                              Cognition Arousal/Alertness: Awake/alert Behavior During Therapy: WFL for tasks assessed/performed Overall Cognitive Status: History of cognitive impairments - at baseline                                 General Comments: history of dementia at baseline, oriented to self only per RN report and PT assessment; very poor safety awareness       Exercises      General Comments        Pertinent Vitals/Pain Pain Assessment: Faces Faces Pain Scale: Hurts a little bit Pain Location: L side with mobility Pain Descriptors / Indicators: Grimacing;Guarding Pain Intervention(s): Limited activity within patient's tolerance;Monitored during session;Repositioned    Home Living  Prior Function            PT Goals (current goals can now be found in the care plan section) Progress towards PT goals: Progressing toward goals    Frequency    Min 2X/week      PT Plan Current plan remains appropriate    Co-evaluation              AM-PAC PT "6 Clicks" Mobility   Outcome Measure  Help needed turning from your back to your side while in a flat bed without using bedrails?: A Lot Help needed moving from lying on your back to sitting on the side of a flat bed without using bedrails?: A Lot Help needed moving to and from a bed to a chair (including a wheelchair)?: A Lot Help needed standing up from a chair using your arms (e.g., wheelchair or bedside chair)?: A  Lot Help needed to walk in hospital room?: Total Help needed climbing 3-5 steps with a railing? : Total 6 Click Score: 10    End of Session Equipment Utilized During Treatment: Gait belt;Other (comment)(pt not on oxygen upon arrival) Activity Tolerance: Patient tolerated treatment well Patient left: with call bell/phone within reach;in bed;with bed alarm set Nurse Communication: Need for lift equipment;Mobility status;Other (comment)([place]) PT Visit Diagnosis: Unsteadiness on feet (R26.81);Muscle weakness (generalized) (M62.81);History of falling (Z91.81);Difficulty in walking, not elsewhere classified (R26.2);Other abnormalities of gait and mobility (R26.89)     Time: 5465-6812 PT Time Calculation (min) (ACUTE ONLY): 20 min  Charges:  $Gait Training: 8-22 mins                     Earney Navy, PTA Acute Rehabilitation Services Pager: [phone] Office: [phone]     Darliss Cheney 08/04/2018, 5:30 PM

## 2018-08-04 NOTE — Progress Notes (Signed)
  Speech Language Pathology  Patient Details Name: Ronald English MRN: [id number] DOB: [birth year] Today's Date: 08/04/2018 Time:  -     SLP following. Family will need to decide on comfort feeds. ST can assist with diet recommendations if/when needed. Please notify with new order ("comfort feed rec's" in comment section) or this therapist via secure chat message. Thank you              GO                Houston Siren 08/04/2018, 8:44 AM   Orbie Pyo Colvin Caroli.Ed Risk analyst [phone] Office [phone]

## 2018-08-04 NOTE — Care Management Important Message (Signed)
Important Message  Patient Details  Name: Ronald English MRN: [id number] Date of Birth: 16-Jul-1926   Medicare Important Message Given:  Yes  Due to illness patient did not sign. Unsigned copy left at patient bedside  Orbie Pyo 08/04/2018, 12:16 PM

## 2018-08-04 NOTE — Plan of Care (Signed)
°  Problem: Respiratory: °Goal: Ability to maintain adequate ventilation will improve °Outcome: Progressing °  °

## 2018-08-04 NOTE — Progress Notes (Signed)
Pt on telesitter.

## 2018-08-04 NOTE — Consult Note (Signed)
Consultation Note Date: 08/04/2018   Patient Name: Ronald English  DOB: 03-May-1926  MRN: [id number]  Age / Sex: 85 y.o., male  PCP: Levin Erp, MD Referring Physician: Dessa Phi, DO  Reason for Consultation: Establishing goals of care and Hospice Evaluation  HPI/Patient Profile: 85 y.o. male  with past medical history of MI in 1993, CAD, carotid stenosis, high blood pressure and cholesterol, history of pneumonia, angioplasty with 4 stents in 2006, former smoker 30-pack-year history quit 50 years ago, admitted on 08/01/2018 with community-acquired pneumonia, right middle lobe pneumonia, concern for aspiration.  Palliative medicine team consulted for goals of care, Progressive dementia, dysphagia, frail, declining rapidly last month. Discussed with son Richardson Landry) regarding goals of care. He is interested in meeting/discussing with palliative care. Please call Richardson Landry or [name] (daughter-in-law) to set up meeting.  Clinical Assessment and Goals of Care: Mr. Mansfield is lying quietly in bed.  He is extremely frail and thin.  He does not open his eyes or try to communicate with me in any meaningful way when I touch his leg and speak to him.  There is no family at bedside at this time.  Call to son Richardson Landry at cell phone and home phone numbers, left voicemail message requesting return call on both.  Call to daughter-in-law [name] at cell phone number.  We speak about Mr. Rotan frailty.  She shares that Mr. Peart had been at [facility] rehab for about 4 weeks and then returned home.  She shares that over the last 3 to 4 weeks his aspiration, choking when eating, has worsened.  I share that speech therapy is recommended nothing by mouth, he is aspirating on pured foods.  I share our questions about comfort feeding.  We also discussed rehab potential.  I share that the medical team believes Mr. Cudworth would be  appropriate for residential hospice.  I share that palliative medicine team will bring up hospice placement during meeting tomorrow, this is not goals responsibility.  We talked about healthcare power of attorney, see below.  Daughter Addison Lank is healthcare power of attorney.  [name] is agreeable to reach out to Richardson Landry, Mrs. Luvenia Heller, and Leonides Sake to coordinate family meeting.  Family meeting 1/18 at 10 AM at bedside.  Conference with hospitalist, Dr. Maylene Roes related to goals of care, family meeting.  HCPOA   HCPOA -daughter-in-law [name] states that daughter Addison Lank has healthcare power of attorney, son Richardson Landry has [place].   SUMMARY OF RECOMMENDATIONS   Family meeting 1/18 at 10 AM at bedside. At this point, no CPR, no intubation  Code Status/Advance Care Planning:  DNR  Symptom Management:   Per hospitalist, no additional needs at this time.  Palliative Prophylaxis:   Aspiration and Turn Reposition  Additional Recommendations (Limitations, Scope, Preferences):  At this point, continue to treat the treatable  Psycho-social/Spiritual:   Desire for further Chaplaincy support:no  Additional Recommendations: Caregiving  Support/Resources and Education on Hospice  Prognosis:   < 2 weeks, would be expected if  family decides to focus on full comfort care.  Even with full scope treatment 2 months or less would not be surprising.  Discharge Planning: To be determined, based on family choice.      Primary Diagnoses: Present on Admission: . CAP (community acquired pneumonia) . Dementia with behavioral disturbance ([place]) . CKD (chronic kidney disease), stage III ([place]) . CAD (coronary artery disease) . Pneumonia   I have reviewed the medical record, interviewed the patient and family, and examined the patient. The following aspects are pertinent.  Past Medical History:  Diagnosis Date  . Anteroseptal myocardial infarction [facility]) 1993   2006 left main  normal, right coronary artery patent stent, circumflex 30% marginal stenosis, LAD distal 90% stenosis. There is a patent proximal stent. He has apparently had Cypher stenting to the distal segment.  Marland Kitchen CAD (coronary artery disease)   . Carotid stenosis   . Hypercholesterolemia   . Hypertension   . Hypothyroidism   . Pneumonia    "once several years ago" (01/09/2013)  . Skin cancer of face    "couple times; off my forehead" (01/09/2013)  . Skull fracture ([place])    "when I was in the service" (01/09/2013)   Social History   Socioeconomic History  . Marital status: Married    Spouse name: Not on file  . Number of children: Not on file  . Years of education: Not on file  . Highest education level: Not on file  Occupational History  . Not on file  Social Needs  . Financial resource strain: Not on file  . Food insecurity:    Worry: Not on file    Inability: Not on file  . Transportation needs:    Medical: Not on file    Non-medical: Not on file  Tobacco Use  . Smoking status: Former Smoker    Packs/day: 1.50    Years: 20.00    Pack years: 30.00    Types: Cigarettes    Last attempt to quit: 07/19/1968    Years since quitting: 50.0  . Smokeless tobacco: Former Systems developer    Types: [place] date: 07/19/2012  Substance and Sexual Activity  . Alcohol use: Yes    Comment: 01/09/2013 "used to drink some when I was young; haven't drank for 2-3 yrs"  . Drug use: No  . Sexual activity: Never  Lifestyle  . Physical activity:    Days per week: Not on file    Minutes per session: Not on file  . Stress: Not on file  Relationships  . Social connections:    Talks on phone: Not on file    Gets together: Not on file    Attends religious service: Not on file    Active member of club or organization: Not on file    Attends meetings of clubs or organizations: Not on file    Relationship status: Not on file  Other Topics Concern  . Not on file  Social History Narrative  . Not on file    Family History  Problem Relation Age of Onset  . Stroke Mother   . Angina Father    Scheduled Meds: . heparin  5,000 Units Subcutaneous Q8H  . nicotine  7 mg Transdermal Daily  . risperiDONE  0.5 mg Oral Daily  . risperiDONE  1 mg Oral QHS   Continuous Infusions: . sodium chloride 75 mL/hr at 08/03/18 1323  . cefTRIAXone (ROCEPHIN)  IV 2 g (08/03/18 2019)   PRN Meds:.acetaminophen **OR**  acetaminophen, ondansetron **OR** ondansetron (ZOFRAN) IV, RESOURCE THICKENUP CLEAR Medications Prior to Admission:  Prior to Admission medications   Medication Sig Start Date End Date Taking? Authorizing Provider  aspirin 81 MG chewable tablet Chew 1 tablet (81 mg total) by mouth daily. 03/14/18  Yes Alphonzo Grieve, MD  atorvastatin (LIPITOR) 40 MG tablet Take 1 tablet (40 mg total) by mouth daily at 6 PM. 03/13/18  Yes Alphonzo Grieve, MD  risperiDONE (RISPERDAL M-TABS) 0.5 MG disintegrating tablet Take 1 tablet (0.5 mg total) by mouth daily. 03/14/18  Yes Alphonzo Grieve, MD  risperiDONE (RISPERDAL M-TABS) 1 MG disintegrating tablet Take 1 tablet (1 mg total) by mouth at bedtime. 03/13/18  Yes Alphonzo Grieve, MD  thyroid (ARMOUR) 30 MG tablet Take 1 tablet (30 mg total) by mouth daily before breakfast. 03/14/18  Yes Alphonzo Grieve, MD  Maltodextrin-Xanthan Gum (RESOURCE THICKENUP CLEAR) POWD Take 120 g by mouth as needed. 03/13/18   Alphonzo Grieve, MD  nicotine (NICODERM CQ - DOSED IN MG/24 HR) 7 mg/24hr patch Place 1 patch (7 mg total) onto the skin daily. 03/14/18   Alphonzo Grieve, MD   Allergies  Allergen Reactions  . Codeine Itching   Review of Systems  Unable to perform ROS: Dementia    Physical Exam Vitals signs and nursing note reviewed.  Constitutional:      General: He is not in acute distress.    Comments: Appears very weak and frail, muscle wasting.   HENT:     Head: Atraumatic.     Comments: Some temporal wasting Cardiovascular:     Rate and Rhythm: Normal rate.   Pulmonary:     Effort: Pulmonary effort is normal. No respiratory distress.  Abdominal:     General: Abdomen is flat. There is no distension.  Musculoskeletal:     Comments: Severe muscle wasting   Neurological:     Comments: Known dementia, sleepy and does not open eyes to touch     Vital Signs: BP (!) 159/85 (BP Location: Left Arm)   Pulse 86   Temp 97.9 F (36.6 C) (Oral)   Resp 20   SpO2 98%  Pain Scale: PAINAD   Pain Score: 0-No pain   SpO2: SpO2: 98 % O2 Device:SpO2: 98 % O2 Flow Rate: .O2 Flow Rate (L/min): 2 L/min  IO: Intake/output summary:   Intake/Output Summary (Last 24 hours) at 08/04/2018 1049 Last data filed at 08/04/2018 0600 Gross per 24 hour  Intake 0 ml  Output 775 ml  Net -775 ml    LBM:   Baseline Weight:   Most recent weight:       Palliative Assessment/Data:   Flowsheet Rows     Most Recent Value  Intake Tab  Referral Department  Hospitalist  Unit at Time of Referral  Med/Surg Unit  Palliative Care Primary Diagnosis  Pulmonary  Date Notified  08/03/18  Palliative Care Type  Return patient Palliative Care  Reason for referral  Clarify Goals of Care, Counsel Regarding Hospice  Date of Admission  08/02/18  Date first seen by Palliative Care  08/04/18  # of days Palliative referral response time  1 Day(s)  # of days IP prior to Palliative referral  1  Clinical Assessment  Palliative Performance Scale Score  20%  Pain Max last 24 hours  Not able to report  Pain Min Last 24 hours  Not able to report  Dyspnea Max Last 24 Hours  Not able to report  Dyspnea Min Last 24 hours  Not able to report  Psychosocial & Spiritual Assessment  Palliative Care Outcomes  Patient/Family wishes: Interventions discontinued/not started   Mechanical Ventilation      Time In: 1040  Time Out: 1115 Time Total: 35 minutes  Greater than 50%  of this time was spent counseling and coordinating care related to the above assessment and plan.  Signed  by: Drue Novel, NP   Please contact Palliative Medicine Team phone at [phone] for questions and concerns.  For individual provider: See Shea Evans

## 2018-08-04 NOTE — Progress Notes (Signed)
PROGRESS NOTE    Ronald English  KZS:[id number] DOB: Sep 14, 1925 DOA: 08/01/2018 PCP: Levin Erp, MD     Brief Narrative:  Ronald English is a 85 y.o. male with medical history significant for vascular dementia, CAD, history of CVA, hypothyroidism, and dysphasia, now presenting to the emergency department after a fall at home. He has reportedly been experiencing progressive generalized weakness and a general decline over the past few months since he had a CVA.  A dysphagia 1 diet with nectar thickened liquids had been recommended in August, but after returning home from a nursing facility, he has not been following any particular dietary recommendations and has not been thickening his liquids.  He consistently chokes while drinking and eating per family and frequently vomits while trying to eat and drink. Xhest x-ray is notable for COPD with patchy atelectasis versus pneumonia in the right middle lobe.  Patient was admitted for community-acquired pneumonia with aspiration concern.  New events last 24 hours / Subjective: Per nursing, patient did not sleep well overnight.  No other acute events.  Remains n.p.o.  Assessment & Plan:   Principal Problem:   CAP (community acquired pneumonia) Active Problems:   CAD (coronary artery disease)   H/O: hypothyroidism   Dysphagia   Dementia with behavioral disturbance (HCC)   CKD (chronic kidney disease), stage III ([place])   Pneumonia   Community-acquired pneumonia and right middle lobe, concern for aspiration with noncompliance to dysphagia diet Continue Rocephin  Dysphagia Obvious aspiration episodes even with dysphagia 1 diet.  NPO   UTI Unclear if patient with symptoms or not due to baseline dementia. Large leukocytes, positive nitrite, >50 WBC on UA. Urine culture ordered. On rocephin as above.   Fall Right hip xray negative for fracture. CT head/c-spine without acute abnormality. PT recommending SNF.   AKI on CKD stage III Baseline  creatinine 1.2. IVF.    Dementia Continue risperidone  CAD Aspirin, lipitor on hold while NPO  Goals of care Discussed with son. Palliative care medicine consulted.    DVT prophylaxis: Subcutaneous heparin Code Status: DNR Family Communication: No family at bedside Disposition Plan: Pending goals of care discussion. Patient is hospice appropriate on my evaluation. Continues to be severe aspiration risk, very cachectic and frail.    Consultants:   Palliative Care Medicine   Procedures:   None  Antimicrobials:  Anti-infectives (From admission, onward)   Start     Dose/Rate Route Frequency Ordered Stop   08/02/18 0400  metroNIDAZOLE (FLAGYL) IVPB 500 mg  Status:  Discontinued     500 mg 100 mL/hr over 60 Minutes Intravenous Every 8 hours 08/01/18 2009 08/03/18 0738   08/01/18 1945  cefTRIAXone (ROCEPHIN) 2 g in sodium chloride 0.9 % 100 mL IVPB     2 g 200 mL/hr over 30 Minutes Intravenous Every 24 hours 08/01/18 1935     08/01/18 1945  azithromycin (ZITHROMAX) 500 mg in sodium chloride 0.9 % 250 mL IVPB  Status:  Discontinued     500 mg 250 mL/hr over 60 Minutes Intravenous Every 24 hours 08/01/18 1935 08/03/18 0738   08/01/18 1945  metroNIDAZOLE (FLAGYL) IVPB 500 mg     500 mg 100 mL/hr over 60 Minutes Intravenous  Once 08/01/18 1935 08/01/18 2241       Objective: Vitals:   08/03/18 0322 08/03/18 1527 08/03/18 2154 08/04/18 0600  BP: (!) 158/82 (!) 152/81 (!) 171/94 (!) 159/85  Pulse: 90 77 90 86  Resp: 20 18 20  20  Temp: 98.5 F (36.9 C) (!) 97.3 F (36.3 C) 97.7 F (36.5 C) 97.9 F (36.6 C)  TempSrc: Oral Oral Oral Oral  SpO2: 93% 99% 100% 98%    Intake/Output Summary (Last 24 hours) at 08/04/2018 1051 Last data filed at 08/04/2018 0600 Gross per 24 hour  Intake 0 ml  Output 775 ml  Net -775 ml   There were no vitals filed for this visit.  Examination: General exam: Appears calm and comfortable, very cachectic and frail-appearing Respiratory  system: Clear to auscultation. Respiratory effort normal. Cardiovascular system: S1 & S2 heard, RRR. No JVD, murmurs, rubs, gallops or clicks. No pedal edema. Gastrointestinal system: Abdomen is nondistended, soft and nontender. Extremities: Symmetric    Data Reviewed: I have personally reviewed following labs and imaging studies  CBC: Recent Labs  Lab 08/01/18 1733 08/02/18 0250 08/03/18 0258  WBC 14.7* 10.7* 9.9  NEUTROABS  --  8.8*  --   HGB 11.3* 10.9* 11.4*  HCT 34.6* 33.5* 34.9*  MCV 88.9 89.8 90.9  PLT 311 276 409   Basic Metabolic Panel: Recent Labs  Lab 08/01/18 1733 08/02/18 0250 08/03/18 0258 08/04/18 0442  NA 139 142 143 142  K 3.9 3.4* 3.6 3.5  CL 102 106 108 108  CO2 23 19* 22 21*  GLUCOSE 132* 104* 103* 81  BUN 40* 37* 31* 23  CREATININE 1.68* 1.44* 1.33* 1.30*  CALCIUM 11.7* 10.7* 11.1* 10.7*   GFR: CrCl cannot be calculated (Unknown ideal weight.). Liver Function Tests: Recent Labs  Lab 08/01/18 1733  AST 22  ALT 15  ALKPHOS 75  BILITOT 1.4*  PROT 7.7  ALBUMIN 3.2*   No results for input(s): LIPASE, AMYLASE in the last 168 hours. No results for input(s): AMMONIA in the last 168 hours. Coagulation Profile: No results for input(s): INR, PROTIME in the last 168 hours. Cardiac Enzymes: No results for input(s): CKTOTAL, CKMB, CKMBINDEX, TROPONINI in the last 168 hours. BNP (last 3 results) No results for input(s): PROBNP in the last 8760 hours. HbA1C: No results for input(s): HGBA1C in the last 72 hours. CBG: No results for input(s): GLUCAP in the last 168 hours. Lipid Profile: No results for input(s): CHOL, HDL, LDLCALC, TRIG, CHOLHDL, LDLDIRECT in the last 72 hours. Thyroid Function Tests: No results for input(s): TSH, T4TOTAL, FREET4, T3FREE, THYROIDAB in the last 72 hours. Anemia Panel: No results for input(s): VITAMINB12, FOLATE, FERRITIN, TIBC, IRON, RETICCTPCT in the last 72 hours. Sepsis Labs: No results for input(s):  PROCALCITON, LATICACIDVEN in the last 168 hours.  No results found for this or any previous visit (from the past 240 hour(s)).     Radiology Studies: No results found.    Scheduled Meds: . heparin  5,000 Units Subcutaneous Q8H  . nicotine  7 mg Transdermal Daily  . risperiDONE  0.5 mg Oral Daily  . risperiDONE  1 mg Oral QHS   Continuous Infusions: . sodium chloride 75 mL/hr at 08/03/18 1323  . cefTRIAXone (ROCEPHIN)  IV 2 g (08/03/18 2019)     LOS: 2 days    Time spent: 20 minutes   Dessa Phi, DO [facility] [email or web address] 08/04/2018, 10:51 AM

## 2018-08-04 NOTE — Progress Notes (Signed)
CSW called and spoke with Christa at [facility]. They are unable to make an offer at this time due to not having any available beds.   CSW called and left a voicemail for patients son and wife.   CSW called and spoke with patients daughter-in-law and informed her that [facility]. CSW emailed her a SNF list. She will be discussing with her mother-in-law and husband to find a SNF facility for the patient.   CSW will continue to follow.   Domenic Schwab, MSW, [place]

## 2018-08-05 DIAGNOSIS — Z7189 Other specified counseling: Secondary | ICD-10-CM

## 2018-08-05 LAB — BASIC METABOLIC PANEL
Anion gap: 15 (ref 5–15)
BUN: 27 mg/dL — ABNORMAL HIGH (ref 8–23)
CO2: 18 mmol/L — ABNORMAL LOW (ref 22–32)
Calcium: 10.5 mg/dL — ABNORMAL HIGH (ref 8.9–10.3)
Chloride: 111 mmol/L (ref 98–111)
Creatinine, Ser: 1.33 mg/dL — ABNORMAL HIGH (ref 0.61–1.24)
GFR calc Af Amer: 54 mL/min — ABNORMAL LOW (ref >60)
GFR calc non Af Amer: 47 mL/min — ABNORMAL LOW (ref >60)
Glucose, Bld: 73 mg/dL (ref 70–99)
Potassium: 3.6 mmol/L (ref 3.5–5.1)
Sodium: 144 mmol/L (ref 135–145)

## 2018-08-05 MED ORDER — MORPHINE SULFATE (CONCENTRATE) 10 MG/0.5ML PO SOLN
2.6000 mg | ORAL | Status: AC | PRN
  Administered 2018-08-05 – 2018-08-08 (×2): 5 mg via ORAL
  Filled 2018-08-05 (×2): qty 0.5

## 2018-08-05 MED ORDER — POLYVINYL ALCOHOL 1.4 % OP SOLN
2.0000 [drp] | OPHTHALMIC | Status: DC | PRN
  Filled 2018-08-05: qty 15

## 2018-08-05 MED ORDER — BISACODYL 10 MG RE SUPP: 10.0000 mg | Freq: Every day | RECTAL | Status: DC | PRN

## 2018-08-05 MED ORDER — LORAZEPAM 2 MG/ML PO CONC
0.5000 mg | ORAL | Status: DC | PRN
  Administered 2018-08-08 – 2018-08-09 (×2): 1 mg via ORAL
  Filled 2018-08-05 (×2): qty 1

## 2018-08-05 NOTE — Progress Notes (Signed)
PROGRESS NOTE    Ronald English  WSF:[id number] DOB: [birth year] DOA: 08/01/2018 PCP: Levin Erp, MD     Brief Narrative:  Ronald English is a 85 y.o. male with medical history significant for vascular dementia, CAD, history of CVA, hypothyroidism, and dysphasia, now presenting to the emergency department after a fall at home. He has reportedly been experiencing progressive generalized weakness and a general decline over the past few months since he had a CVA.  A dysphagia 1 diet with nectar thickened liquids had been recommended in August, but after returning home from a nursing facility, he has not been following any particular dietary recommendations and has not been thickening his liquids.  He consistently chokes while drinking and eating per family and frequently vomits while trying to eat and drink. Xhest x-ray is notable for COPD with patchy atelectasis versus pneumonia in the right middle lobe.  Patient was admitted for community-acquired pneumonia with aspiration concern.  Due to continued aspiration on dysphagia 1 diet, palliative care was consulted.  New events last 24 hours / Subjective: No acute events overnight  Assessment & Plan:   Principal Problem:   CAP (community acquired pneumonia) Active Problems:   CAD (coronary artery disease)   H/O: hypothyroidism   Dysphagia   Dementia with behavioral disturbance (HCC)   CKD (chronic kidney disease), stage III (HCC)   Pneumonia   Community-acquired pneumonia and right middle lobe, concern for aspiration with noncompliance to dysphagia diet Dysphagia UTI, POA  Fall AKI on CKD stage III Dementia CAD  Patient to transition to full comfort care.  Appreciate palliative care medicine    Consultants:   Palliative Care Medicine   Procedures:   None  Antimicrobials:  Anti-infectives (From admission, onward)   Start     Dose/Rate Route Frequency Ordered Stop   08/02/18 0400  metroNIDAZOLE (FLAGYL) IVPB 500 mg   Status:  Discontinued     500 mg 100 mL/hr over 60 Minutes Intravenous Every 8 hours 08/01/18 2009 08/03/18 0738   08/01/18 1945  cefTRIAXone (ROCEPHIN) 2 g in sodium chloride 0.9 % 100 mL IVPB  Status:  Discontinued     2 g 200 mL/hr over 30 Minutes Intravenous Every 24 hours 08/01/18 1935 08/05/18 1106   08/01/18 1945  azithromycin (ZITHROMAX) 500 mg in sodium chloride 0.9 % 250 mL IVPB  Status:  Discontinued     500 mg 250 mL/hr over 60 Minutes Intravenous Every 24 hours 08/01/18 1935 08/03/18 0738   08/01/18 1945  metroNIDAZOLE (FLAGYL) IVPB 500 mg     500 mg 100 mL/hr over 60 Minutes Intravenous  Once 08/01/18 1935 08/01/18 2241       Objective: Vitals:   08/04/18 0600 08/04/18 1531 08/04/18 2043 08/05/18 0551  BP: (!) 159/85 (!) 144/80 (!) 153/68 108/60  Pulse: 86 96 85 96  Resp: 20 18 17 18   Temp: 97.9 F (36.6 C) 98 F (36.7 C) 97.7 F (36.5 C) 98.3 F (36.8 C)  TempSrc: Oral Oral Oral Oral  SpO2: 98% 95%      Intake/Output Summary (Last 24 hours) at 08/05/2018 1207 Last data filed at 08/05/2018 0507 Gross per 24 hour  Intake 2362.94 ml  Output 150 ml  Net 2212.94 ml   There were no vitals filed for this visit.  Examination: General exam: Appears calm and comfortable, unresponsive to voice, very frail, cachectic, malnourished Respiratory system: Clear to auscultation. Respiratory effort normal. Cardiovascular system: S1 & S2 heard, RRR.     Data  Reviewed: I have personally reviewed following labs and imaging studies  CBC: Recent Labs  Lab 08/01/18 1733 08/02/18 0250 08/03/18 0258  WBC 14.7* 10.7* 9.9  NEUTROABS  --  8.8*  --   HGB 11.3* 10.9* 11.4*  HCT 34.6* 33.5* 34.9*  MCV 88.9 89.8 90.9  PLT 311 276 169   Basic Metabolic Panel: Recent Labs  Lab 08/01/18 1733 08/02/18 0250 08/03/18 0258 08/04/18 0442 08/05/18 0309  NA 139 142 143 142 144  K 3.9 3.4* 3.6 3.5 3.6  CL 102 106 108 108 111  CO2 23 19* 22 21* 18*  GLUCOSE 132* 104* 103* 81  73  BUN 40* 37* 31* 23 27*  CREATININE 1.68* 1.44* 1.33* 1.30* 1.33*  CALCIUM 11.7* 10.7* 11.1* 10.7* 10.5*   GFR: CrCl cannot be calculated (Unknown ideal weight.). Liver Function Tests: Recent Labs  Lab 08/01/18 1733  AST 22  ALT 15  ALKPHOS 75  BILITOT 1.4*  PROT 7.7  ALBUMIN 3.2*   No results for input(s): LIPASE, AMYLASE in the last 168 hours. No results for input(s): AMMONIA in the last 168 hours. Coagulation Profile: No results for input(s): INR, PROTIME in the last 168 hours. Cardiac Enzymes: No results for input(s): CKTOTAL, CKMB, CKMBINDEX, TROPONINI in the last 168 hours. BNP (last 3 results) No results for input(s): PROBNP in the last 8760 hours. HbA1C: No results for input(s): HGBA1C in the last 72 hours. CBG: No results for input(s): GLUCAP in the last 168 hours. Lipid Profile: No results for input(s): CHOL, HDL, LDLCALC, TRIG, CHOLHDL, LDLDIRECT in the last 72 hours. Thyroid Function Tests: No results for input(s): TSH, T4TOTAL, FREET4, T3FREE, THYROIDAB in the last 72 hours. Anemia Panel: No results for input(s): VITAMINB12, FOLATE, FERRITIN, TIBC, IRON, RETICCTPCT in the last 72 hours. Sepsis Labs: No results for input(s): PROCALCITON, LATICACIDVEN in the last 168 hours.  No results found for this or any previous visit (from the past 240 hour(s)).     Radiology Studies: No results found.    Scheduled Meds: . risperiDONE  0.5 mg Oral Daily  . risperiDONE  1 mg Oral QHS   Continuous Infusions:    LOS: 3 days    Time spent: 20 minutes   Dessa Phi, DO [facility] [email or web address] 08/05/2018, 12:07 PM

## 2018-08-05 NOTE — Progress Notes (Signed)
Pt on telesitter , discontinued [employer] cabin crew, family at the bedside.

## 2018-08-05 NOTE — Progress Notes (Signed)
CSW spoke with Jamse Belfast, son Durable Power of Attorney at [phone] and [place] at [phone].  Both parties have agreed that they prefer to have pt placed at [facility] and [place] [facility]. CSW also spoke with Harmon Pier to let her know of the family's choice.  CSW will continue to follow.  Reed Breech LCSWA [phone]

## 2018-08-05 NOTE — Progress Notes (Signed)
Hospice and [facility]) Hospital Liaison: RN Visit  3:00pm   Received request from [place] for family interest in [facility]. Chart reviewed and spoke with family (son- Richardson Landry [phone])  to acknowledge referral.  RN visited pt at bedside today however pt was sleeping and no family was present. Brochure left at beside. Briefly spoke with the bedside nurse The[place] with report. Unfortunately [facility] is not able to offer a bed today. Family and CSW are aware [facility] liaison will follow up with CSW and family tomorrow or sooner if room becomes available. Please do not hesitate to call with questions.  Thank you. ?  Anant, [place], [facility] Liaison  [place] are on AM

## 2018-08-05 NOTE — Progress Notes (Signed)
Pt IVF fluid and nicotine patch was discontinued, repositioned, given Morphine 1x for pain, still with condom cath, and peripheral IV line saline lock, low bed, floor mattress, mittens, on tele sitter, bed alarm, close monitoring.

## 2018-08-05 NOTE — Progress Notes (Signed)
Palliative:   Ronald English is lying quietly in bed.  He is being shaved by his son Ronald English.  He appears weak and frail, will briefly open his eyes when I touch his arm.  Present today at bedside is daughter/healthcare power of attorney, Ronald English and her husband, son Ronald English and his wife Ronald English, and Ronald English wife Ronald English.  We talked in detail about Ronald English inability to swallow safely.  We speak in detail about aspiration pneumonia which is expected to recur.  We talked about the chronic illness pathway, what is normal and expected.  Family shares that Ronald English has had a decline over the last month in particular after aspirating at Christmas time.  Active listening with wife Ronald English related to her sorrow over losing her husband, her caregiving over the last few years, Ronald English declines.  We talked about how to care for Ronald English during this time.  Family is requesting FULL COMFORT care, let nature take its course.  We talked about how to best care for Ronald English, what services are available.  Hospice discussed in detail, choice offered.  Family states that hospice and palliative care of [facility] is near their home.  We talked about what is and is not provided at residential hospice, including IV antibiotics and fluids.  Family is requesting residential hospice at [facility].    Prognosis discussed with permission, 2 weeks or less is expected.  I encouraged family to continue with life review during this time.  Conference with nursing staff related to goals of care discussion, comfort care. Conference with hospitalist related to goals of care discussion, comfort care. Conference with social work related to residential hospice referral.  120 minutes, extended time Quinn Axe, NP  Palliative Medicine Team  Team Phone # [phone]  Greater than 50% of this time was spent counseling and coordinating care related to the above assessment and plan.

## 2018-08-06 NOTE — Progress Notes (Signed)
PROGRESS NOTE    Ronald English  WNU:[id number] DOB: 10-05-1925 DOA: 08/01/2018 PCP: Levin Erp, MD     Brief Narrative:  Ronald English is a 85 y.o. male with medical history significant for vascular dementia, CAD, history of CVA, hypothyroidism, and dysphasia, now presenting to the emergency department after a fall at home. He has reportedly been experiencing progressive generalized weakness and a general decline over the past few months since he had a CVA.  A dysphagia 1 diet with nectar thickened liquids had been recommended in August, but after returning home from a nursing facility, he has not been following any particular dietary recommendations and has not been thickening his liquids.  He consistently chokes while drinking and eating per family and frequently vomits while trying to eat and drink. Xhest x-ray is notable for COPD with patchy atelectasis versus pneumonia in the right middle lobe.  Patient was admitted for community-acquired pneumonia with aspiration concern.  Due to continued aspiration on dysphagia 1 diet, palliative care was consulted.  Assessment & Plan:   Principal Problem:   CAP (community acquired pneumonia) Active Problems:   CAD (coronary artery disease)   H/O: hypothyroidism   Dysphagia   Dementia with behavioral disturbance (HCC)   CKD (chronic kidney disease), stage III ([place])   Pneumonia   Encounter for hospice care discussion   Community-acquired pneumonia and right middle lobe, concern for aspiration with noncompliance to dysphagia diet Dysphagia UTI, POA  Fall AKI on CKD stage III Dementia CAD  Full comfort care. Awaiting residential hospice placement.    Consultants:   Palliative Care Medicine   Procedures:   None  Antimicrobials:  Anti-infectives (From admission, onward)   Start     Dose/Rate Route Frequency Ordered Stop   08/02/18 0400  metroNIDAZOLE (FLAGYL) IVPB 500 mg  Status:  Discontinued     500 mg 100 mL/hr over 60  Minutes Intravenous Every 8 hours 08/01/18 2009 08/03/18 0738   08/01/18 1945  cefTRIAXone (ROCEPHIN) 2 g in sodium chloride 0.9 % 100 mL IVPB  Status:  Discontinued     2 g 200 mL/hr over 30 Minutes Intravenous Every 24 hours 08/01/18 1935 08/05/18 1106   08/01/18 1945  azithromycin (ZITHROMAX) 500 mg in sodium chloride 0.9 % 250 mL IVPB  Status:  Discontinued     500 mg 250 mL/hr over 60 Minutes Intravenous Every 24 hours 08/01/18 1935 08/03/18 0738   08/01/18 1945  metroNIDAZOLE (FLAGYL) IVPB 500 mg     500 mg 100 mL/hr over 60 Minutes Intravenous  Once 08/01/18 1935 08/01/18 2241       Objective: Vitals:   08/04/18 1531 08/04/18 2043 08/05/18 0551 08/05/18 1423  BP: (!) 144/80 (!) 153/68 108/60 135/64  Pulse: 96 85 96 92  Resp: 18 17 18    Temp: 98 F (36.7 C) 97.7 F (36.5 C) 98.3 F (36.8 C) 98.1 F (36.7 C)  TempSrc: Oral Oral Oral Oral  SpO2: 95%   95%    Intake/Output Summary (Last 24 hours) at 08/06/2018 1047 Last data filed at 08/06/2018 0902 Gross per 24 hour  Intake 0 ml  Output 650 ml  Net -650 ml   There were no vitals filed for this visit.  Examination: General exam: Appears calm and comfortable, sleeping   Data Reviewed: I have personally reviewed following labs and imaging studies  CBC: Recent Labs  Lab 08/01/18 1733 08/02/18 0250 08/03/18 0258  WBC 14.7* 10.7* 9.9  NEUTROABS  --  8.8*  --  HGB 11.3* 10.9* 11.4*  HCT 34.6* 33.5* 34.9*  MCV 88.9 89.8 90.9  PLT 311 276 914   Basic Metabolic Panel: Recent Labs  Lab 08/01/18 1733 08/02/18 0250 08/03/18 0258 08/04/18 0442 08/05/18 0309  NA 139 142 143 142 144  K 3.9 3.4* 3.6 3.5 3.6  CL 102 106 108 108 111  CO2 23 19* 22 21* 18*  GLUCOSE 132* 104* 103* 81 73  BUN 40* 37* 31* 23 27*  CREATININE 1.68* 1.44* 1.33* 1.30* 1.33*  CALCIUM 11.7* 10.7* 11.1* 10.7* 10.5*   GFR: CrCl cannot be calculated (Unknown ideal weight.). Liver Function Tests: Recent Labs  Lab 08/01/18 1733  AST 22   ALT 15  ALKPHOS 75  BILITOT 1.4*  PROT 7.7  ALBUMIN 3.2*   No results for input(s): LIPASE, AMYLASE in the last 168 hours. No results for input(s): AMMONIA in the last 168 hours. Coagulation Profile: No results for input(s): INR, PROTIME in the last 168 hours. Cardiac Enzymes: No results for input(s): CKTOTAL, CKMB, CKMBINDEX, TROPONINI in the last 168 hours. BNP (last 3 results) No results for input(s): PROBNP in the last 8760 hours. HbA1C: No results for input(s): HGBA1C in the last 72 hours. CBG: No results for input(s): GLUCAP in the last 168 hours. Lipid Profile: No results for input(s): CHOL, HDL, LDLCALC, TRIG, CHOLHDL, LDLDIRECT in the last 72 hours. Thyroid Function Tests: No results for input(s): TSH, T4TOTAL, FREET4, T3FREE, THYROIDAB in the last 72 hours. Anemia Panel: No results for input(s): VITAMINB12, FOLATE, FERRITIN, TIBC, IRON, RETICCTPCT in the last 72 hours. Sepsis Labs: No results for input(s): PROCALCITON, LATICACIDVEN in the last 168 hours.  No results found for this or any previous visit (from the past 240 hour(s)).     Radiology Studies: No results found.    Scheduled Meds:  Continuous Infusions:    LOS: 4 days    Time spent: 10 minutes   Dessa Phi, DO [facility] [email or web address] 08/06/2018, 10:47 AM

## 2018-08-06 NOTE — Plan of Care (Signed)
  Problem: Activity: Goal: Ability to tolerate increased activity will improve Outcome: Progressing   Problem: Clinical Measurements: Goal: Ability to maintain a body temperature in the normal range will improve Outcome: Progressing   Problem: Respiratory: Goal: Ability to maintain adequate ventilation will improve Outcome: Progressing Goal: Ability to maintain a clear airway will improve Outcome: Progressing   Problem: Education: Goal: Knowledge of General Education information will improve Description: Including pain rating scale, medication(s)/side effects and non-pharmacologic comfort measures Outcome: Progressing   

## 2018-08-07 DIAGNOSIS — J189 Pneumonia, unspecified organism: Secondary | ICD-10-CM

## 2018-08-07 MED ORDER — GLYCOPYRROLATE 0.2 MG/ML IJ SOLN
0.4000 mg | Freq: Four times a day (QID) | INTRAMUSCULAR | Status: DC | PRN
  Administered 2018-08-08: 0.4 mg via INTRAVENOUS
  Filled 2018-08-07: qty 2

## 2018-08-07 NOTE — Progress Notes (Addendum)
Palliative Medicine RN Note: Called [facility] liaison to confirm they got referral for [facility] written yesterday am; no answer, left message.  Marjie Skiff Koleman Marling, RN, BSN, [facility] Palliative Medicine Team 08/07/2018 12:10 PM Office [phone]   ADDENDUM: [facility] is aware of the referral; no bed to offer today.  Marjie Skiff Bradleigh Sonnen, RN, BSN, [facility] Palliative Medicine Team 08/07/2018 1:14 PM Office [phone]    ADDENDUM: Symptom check. Patient is in bed; his son is shaving him. The patient is relaxed and smiling, speaking non-sensically. Symptoms are managed with current regimen. Plan for f/u tomorrow.  Marjie Skiff Merian Wroe, RN, BSN, [facility] Palliative Medicine Team 08/07/2018 2:03 PM Office [phone]

## 2018-08-07 NOTE — Progress Notes (Signed)
[facility])  Continue to follow for family interest in [facility]. Met briefly with patient's spouse, son and his wife at bedside. They are aware [facility] is not able to offer a room today and an [facility] liaison will continue to follow and update regarding availability. CSW [place] aware.   Thank you,  Erling Conte, LCSW [phone]  [facility] Liaisons are listed on AMION under Hospice and [place]

## 2018-08-07 NOTE — Progress Notes (Signed)
PROGRESS NOTE    Ronald English  YQM:[id number] DOB: 09-Jul-1926 DOA: 08/01/2018 PCP: Levin Erp, MD     Brief Narrative:  Ronald English is a 85 y.o. male with medical history significant for vascular dementia, CAD, history of CVA, hypothyroidism, and dysphasia, now presenting to the emergency department after a fall at home. He has reportedly been experiencing progressive generalized weakness and a general decline over the past few months since he had a CVA.  A dysphagia 1 diet with nectar thickened liquids had been recommended in August, but after returning home from a nursing facility, he has not been following any particular dietary recommendations and has not been thickening his liquids.  He consistently chokes while drinking and eating per family and frequently vomits while trying to eat and drink. Xhest x-ray is notable for COPD with patchy atelectasis versus pneumonia in the right middle lobe.  Patient was admitted for community-acquired pneumonia with aspiration concern.  Due to continued aspiration on dysphagia 1 diet, palliative care was consulted.  Assessment & Plan:   Principal Problem:   CAP (community acquired pneumonia) Active Problems:   CAD (coronary artery disease)   H/O: hypothyroidism   Dysphagia   Dementia with behavioral disturbance (HCC)   CKD (chronic kidney disease), stage III ([place])   Pneumonia   Encounter for hospice care discussion   Community-acquired pneumonia and right middle lobe, concern for aspiration with noncompliance to dysphagia diet Dysphagia UTI, POA  Fall AKI on CKD stage III Dementia CAD  Full comfort care. Awaiting residential hospice placement.    Consultants:   Palliative Care Medicine   Procedures:   None  Antimicrobials:  Anti-infectives (From admission, onward)   Start     Dose/Rate Route Frequency Ordered Stop   08/02/18 0400  metroNIDAZOLE (FLAGYL) IVPB 500 mg  Status:  Discontinued     500 mg 100 mL/hr over 60  Minutes Intravenous Every 8 hours 08/01/18 2009 08/03/18 0738   08/01/18 1945  cefTRIAXone (ROCEPHIN) 2 g in sodium chloride 0.9 % 100 mL IVPB  Status:  Discontinued     2 g 200 mL/hr over 30 Minutes Intravenous Every 24 hours 08/01/18 1935 08/05/18 1106   08/01/18 1945  azithromycin (ZITHROMAX) 500 mg in sodium chloride 0.9 % 250 mL IVPB  Status:  Discontinued     500 mg 250 mL/hr over 60 Minutes Intravenous Every 24 hours 08/01/18 1935 08/03/18 0738   08/01/18 1945  metroNIDAZOLE (FLAGYL) IVPB 500 mg     500 mg 100 mL/hr over 60 Minutes Intravenous  Once 08/01/18 1935 08/01/18 2241       Objective: Vitals:   08/04/18 2043 08/05/18 0551 08/05/18 1423 08/07/18 0604  BP: (!) 153/68 108/60 135/64 (!) 163/93  Pulse: 85 96 92 93  Resp: 17 18  16   Temp: 97.7 F (36.5 C) 98.3 F (36.8 C) 98.1 F (36.7 C) 97.8 F (36.6 C)  TempSrc: Oral Oral Oral Oral  SpO2:   95% 100%    Intake/Output Summary (Last 24 hours) at 08/07/2018 1205 Last data filed at 08/07/2018 0845 Gross per 24 hour  Intake 50 ml  Output 1050 ml  Net -1000 ml   There were no vitals filed for this visit.  Examination: General exam: Appears calm and comfortable, alert, mumbles incoherently   Data Reviewed: I have personally reviewed following labs and imaging studies  CBC: Recent Labs  Lab 08/01/18 1733 08/02/18 0250 08/03/18 0258  WBC 14.7* 10.7* 9.9  NEUTROABS  --  8.8*  --   HGB 11.3* 10.9* 11.4*  HCT 34.6* 33.5* 34.9*  MCV 88.9 89.8 90.9  PLT 311 276 078   Basic Metabolic Panel: Recent Labs  Lab 08/01/18 1733 08/02/18 0250 08/03/18 0258 08/04/18 0442 08/05/18 0309  NA 139 142 143 142 144  K 3.9 3.4* 3.6 3.5 3.6  CL 102 106 108 108 111  CO2 23 19* 22 21* 18*  GLUCOSE 132* 104* 103* 81 73  BUN 40* 37* 31* 23 27*  CREATININE 1.68* 1.44* 1.33* 1.30* 1.33*  CALCIUM 11.7* 10.7* 11.1* 10.7* 10.5*   GFR: CrCl cannot be calculated (Unknown ideal weight.). Liver Function Tests: Recent Labs    Lab 08/01/18 1733  AST 22  ALT 15  ALKPHOS 75  BILITOT 1.4*  PROT 7.7  ALBUMIN 3.2*   No results for input(s): LIPASE, AMYLASE in the last 168 hours. No results for input(s): AMMONIA in the last 168 hours. Coagulation Profile: No results for input(s): INR, PROTIME in the last 168 hours. Cardiac Enzymes: No results for input(s): CKTOTAL, CKMB, CKMBINDEX, TROPONINI in the last 168 hours. BNP (last 3 results) No results for input(s): PROBNP in the last 8760 hours. HbA1C: No results for input(s): HGBA1C in the last 72 hours. CBG: No results for input(s): GLUCAP in the last 168 hours. Lipid Profile: No results for input(s): CHOL, HDL, LDLCALC, TRIG, CHOLHDL, LDLDIRECT in the last 72 hours. Thyroid Function Tests: No results for input(s): TSH, T4TOTAL, FREET4, T3FREE, THYROIDAB in the last 72 hours. Anemia Panel: No results for input(s): VITAMINB12, FOLATE, FERRITIN, TIBC, IRON, RETICCTPCT in the last 72 hours. Sepsis Labs: No results for input(s): PROCALCITON, LATICACIDVEN in the last 168 hours.  No results found for this or any previous visit (from the past 240 hour(s)).     Radiology Studies: No results found.    Scheduled Meds:  Continuous Infusions:    LOS: 5 days    Time spent: 10 minutes   Dessa Phi, DO [facility] [email or web address] 08/07/2018, 12:05 PM

## 2018-08-08 NOTE — Progress Notes (Signed)
   08/08/18 1000  Clinical Encounter Type  Visited With Patient  Visit Type Initial;Social support  Stress Factors  Patient Stress Factors Not reviewed   Spoke a bit to pt, although he was not able to meaningfully converse.  Compassionate presence.  Myra Gianotti resident, [phone]

## 2018-08-08 NOTE — Care Management Important Message (Signed)
Important Message  Patient Details  Name: Ronald English MRN: [id number] Date of Birth: [birth year]   Medicare Important Message Given:  Yes    [name] 08/08/2018, 3:33 PM

## 2018-08-08 NOTE — Plan of Care (Signed)
  Problem: Clinical Measurements: Goal: Ability to maintain a body temperature in the normal range will improve 08/08/2018 0319 by Corinna Lines, RN Outcome: Progressing 08/08/2018 0018 by Corinna Lines, RN Outcome: Progressing   Problem: Respiratory: Goal: Ability to maintain adequate ventilation will improve 08/08/2018 0319 by Corinna Lines, RN Outcome: Progressing 08/08/2018 0018 by Corinna Lines, RN Outcome: Progressing Goal: Ability to maintain a clear airway will improve 08/08/2018 0319 by Corinna Lines, RN Outcome: Progressing 08/08/2018 0018 by Corinna Lines, RN Outcome: Progressing

## 2018-08-08 NOTE — Progress Notes (Signed)
Palliative:  Ronald English is resting quietly in bed.  He appears very weak and frail, near end-of-life.  He does not open his eyes or try to interact with me in any meaningful way when I touch his arm or call his name.  Present today at bedside is his wife Ronald English and grandson, Ronald English son.  We talked about comfort measures, PRN medications.  We talked about aspiration pneumonia, pleasure feeds.  Ronald English states that he feels Ronald English is unable to take any food by mouth at this time, at this point I agree.  Wife and grandson are appropriate at bedside, advocates for Ronald English.  Conference with nursing staff related to patient needs.  Nursing staff share that he received medication for anxiety around noon, this may be why he is sleepy at this time.  We discussed family is concerned about pured diet.  Nursing staff share that Ronald English was alert and opening his mouth for food, but unable to swallow.  They share that they suctioned him thoroughly and feel that he did not aspirate. I encourage nursing staff to continue to offer pleasure liquids as requested/tolerated.  We also discussed symptom management.  Call to son Ronald English, left detailed voicemail message. Conference with case management/social work related to disposition to residential hospice.  31 minutes Quinn Axe, NP  Palliative Medicine Team  Team Phone # [phone]  Greater than 50% of this time was spent counseling and coordinating care related to the above assessment and plan

## 2018-08-08 NOTE — Progress Notes (Signed)
PROGRESS NOTE    Ronald English  OMV:[id number] DOB: Mar 07, 1926 DOA: 08/01/2018 PCP: Levin Erp, MD     Brief Narrative:  Ronald English is a 85 y.o. male with medical history significant for vascular dementia, CAD, history of CVA, hypothyroidism, and dysphasia, now presenting to the emergency department after a fall at home. He has reportedly been experiencing progressive generalized weakness and a general decline over the past few months since he had a CVA.  A dysphagia 1 diet with nectar thickened liquids had been recommended in August, but after returning home from a nursing facility, he has not been following any particular dietary recommendations and has not been thickening his liquids.  He consistently chokes while drinking and eating per family and frequently vomits while trying to eat and drink. Xhest x-ray is notable for COPD with patchy atelectasis versus pneumonia in the right middle lobe.  Patient was admitted for community-acquired pneumonia with aspiration concern.  Due to continued aspiration on dysphagia 1 diet, palliative care was consulted.  Assessment & Plan:   Principal Problem:   CAP (community acquired pneumonia) Active Problems:   CAD (coronary artery disease)   H/O: hypothyroidism   Dysphagia   Dementia with behavioral disturbance (HCC)   CKD (chronic kidney disease), stage III ([place])   Pneumonia   Encounter for hospice care discussion   Community-acquired pneumonia and right middle lobe, concern for aspiration with noncompliance to dysphagia diet Dysphagia UTI, POA  Fall AKI on CKD stage III Dementia CAD  Full comfort care. Awaiting residential hospice placement.    Consultants:   Palliative Care Medicine   Procedures:   None  Antimicrobials:  Anti-infectives (From admission, onward)   Start     Dose/Rate Route Frequency Ordered Stop   08/02/18 0400  metroNIDAZOLE (FLAGYL) IVPB 500 mg  Status:  Discontinued     500 mg 100 mL/hr over 60  Minutes Intravenous Every 8 hours 08/01/18 2009 08/03/18 0738   08/01/18 1945  cefTRIAXone (ROCEPHIN) 2 g in sodium chloride 0.9 % 100 mL IVPB  Status:  Discontinued     2 g 200 mL/hr over 30 Minutes Intravenous Every 24 hours 08/01/18 1935 08/05/18 1106   08/01/18 1945  azithromycin (ZITHROMAX) 500 mg in sodium chloride 0.9 % 250 mL IVPB  Status:  Discontinued     500 mg 250 mL/hr over 60 Minutes Intravenous Every 24 hours 08/01/18 1935 08/03/18 0738   08/01/18 1945  metroNIDAZOLE (FLAGYL) IVPB 500 mg     500 mg 100 mL/hr over 60 Minutes Intravenous  Once 08/01/18 1935 08/01/18 2241       Objective: Vitals:   08/05/18 0551 08/05/18 1423 08/07/18 0604 08/08/18 0600  BP: 108/60 135/64 (!) 163/93 (!) 166/99  Pulse: 96 92 93 (!) 108  Resp: 18  16 20   Temp: 98.3 F (36.8 C) 98.1 F (36.7 C) 97.8 F (36.6 C) 97.9 F (36.6 C)  TempSrc: Oral Oral Oral Oral  SpO2:  95% 100% 100%    Intake/Output Summary (Last 24 hours) at 08/08/2018 1207 Last data filed at 08/08/2018 1045 Gross per 24 hour  Intake 0 ml  Output 450 ml  Net -450 ml   There were no vitals filed for this visit.  Examination: General exam: Appears calm and comfortable    Data Reviewed: I have personally reviewed following labs and imaging studies  CBC: Recent Labs  Lab 08/01/18 1733 08/02/18 0250 08/03/18 0258  WBC 14.7* 10.7* 9.9  NEUTROABS  --  8.8*  --  HGB 11.3* 10.9* 11.4*  HCT 34.6* 33.5* 34.9*  MCV 88.9 89.8 90.9  PLT 311 276 888   Basic Metabolic Panel: Recent Labs  Lab 08/01/18 1733 08/02/18 0250 08/03/18 0258 08/04/18 0442 08/05/18 0309  NA 139 142 143 142 144  K 3.9 3.4* 3.6 3.5 3.6  CL 102 106 108 108 111  CO2 23 19* 22 21* 18*  GLUCOSE 132* 104* 103* 81 73  BUN 40* 37* 31* 23 27*  CREATININE 1.68* 1.44* 1.33* 1.30* 1.33*  CALCIUM 11.7* 10.7* 11.1* 10.7* 10.5*   GFR: CrCl cannot be calculated (Unknown ideal weight.). Liver Function Tests: Recent Labs  Lab 08/01/18 1733    AST 22  ALT 15  ALKPHOS 75  BILITOT 1.4*  PROT 7.7  ALBUMIN 3.2*   No results for input(s): LIPASE, AMYLASE in the last 168 hours. No results for input(s): AMMONIA in the last 168 hours. Coagulation Profile: No results for input(s): INR, PROTIME in the last 168 hours. Cardiac Enzymes: No results for input(s): CKTOTAL, CKMB, CKMBINDEX, TROPONINI in the last 168 hours. BNP (last 3 results) No results for input(s): PROBNP in the last 8760 hours. HbA1C: No results for input(s): HGBA1C in the last 72 hours. CBG: No results for input(s): GLUCAP in the last 168 hours. Lipid Profile: No results for input(s): CHOL, HDL, LDLCALC, TRIG, CHOLHDL, LDLDIRECT in the last 72 hours. Thyroid Function Tests: No results for input(s): TSH, T4TOTAL, FREET4, T3FREE, THYROIDAB in the last 72 hours. Anemia Panel: No results for input(s): VITAMINB12, FOLATE, FERRITIN, TIBC, IRON, RETICCTPCT in the last 72 hours. Sepsis Labs: No results for input(s): PROCALCITON, LATICACIDVEN in the last 168 hours.  No results found for this or any previous visit (from the past 240 hour(s)).     Radiology Studies: No results found.    Scheduled Meds:  Continuous Infusions:    LOS: 6 days    Time spent: 10 minutes   Dessa Phi, DO [facility] [email or web address] 08/08/2018, 12:07 PM

## 2018-08-08 NOTE — Social Work (Addendum)
There are currently no available beds at [facility], continue to follow. Should no beds continue to be available will discuss alternate hospice placement with pt family.  CSW continuing to follow for support with disposition.  Westley Hummer, MSW, [place] Work [phone]

## 2018-08-09 MED ORDER — MORPHINE SULFATE (CONCENTRATE) 10 MG/0.5ML PO SOLN
2.6000 mg | ORAL | Status: DC | PRN
  Administered 2018-08-09: 5 mg via ORAL
  Filled 2018-08-09: qty 0.5

## 2018-08-09 NOTE — Discharge Summary (Signed)
Physician Discharge Summary  Ronald English NTI:[id number] DOB: 1926/07/04 DOA: 08/01/2018  PCP: Ronald Erp, MD  Admit date: 08/01/2018 Discharge date: 08/09/2018  Disposition:  Residential hospice   Brief/Interim Summary: Ronald Jarvis Matthewsis a 85 y.o.malewith medical history significant forvascular dementia, CAD, history of CVA, hypothyroidism, and dysphasia, now presenting to the emergency department after a fall at home. He has reportedly been experiencing progressive generalized weakness and a general decline over the past few months since he had a CVA. A dysphagia 1 diet with nectar thickened liquids had been recommended in August, but after returning home from a nursing facility, he has not been following any particular dietary recommendations and has not been thickening his liquids. He consistently chokes while drinking and eating per family and frequently vomits while trying to eat and drink. Xhest x-ray is notable for COPD with patchy atelectasis versus pneumonia in the right middle lobe.  Patient was admitted for community-acquired pneumonia with aspiration concern.  Due to continued aspiration on dysphagia 1 diet, palliative care was consulted.  Family met with palliative care services, decided on full comfort care.  Patient was transition to residential hospice.  Discharge Diagnoses:  Community-acquired pneumonia and right middle lobe, concern for aspiration with noncompliance to dysphagia diet Dysphagia UTI, POA  Fall AKI on CKD stage III Dementia CAD  Discharge Instructions   Allergies as of 08/09/2018      Reactions   Codeine Itching      Medication List    STOP taking these medications   aspirin 81 MG chewable tablet   atorvastatin 40 MG tablet Commonly known as:  LIPITOR   nicotine 7 mg/24hr patch Commonly known as:  NICODERM CQ - dosed in mg/24 hr   RESOURCE THICKENUP CLEAR Powd   risperiDONE 0.5 MG disintegrating tablet Commonly known as:  RISPERDAL  M-TABS   risperiDONE 1 MG disintegrating tablet Commonly known as:  RISPERDAL M-TABS   thyroid 30 MG tablet Commonly known as:  ARMOUR       Allergies  Allergen Reactions  . Codeine Itching    Consultations:  Palliative care   Procedures/Studies: Dg Chest 2 View  Result Date: 08/01/2018 CLINICAL DATA:  [place] from his bed. Ex-smoker. EXAM: CHEST - 2 VIEW COMPARISON:  03/10/2018, 02/28/2014 and 02/26/2014. FINDINGS: The previously noted small calcified granuloma in the left mid lung zone is unchanged since 02/26/2014. The lungs remain hyperexpanded with interval mild patchy opacity in the right lower lung zone since 03/10/2018. This is in the right middle lobe on the lateral view. Multiple thoracic vertebral compression deformities have not changed significantly since 02/28/2014. No acute fracture or pneumothorax is seen. IMPRESSION: 1. Interval mild patchy atelectasis or pneumonia in the right middle lobe. Pulmonary contusion is less likely in the absence of rib fractures. 2. Stable changes of COPD and left lung calcified granuloma. Electronically Signed   By: Claudie Revering M.D.   On: 08/01/2018 19:26   Ct Head Wo Contrast  Result Date: 08/01/2018 CLINICAL DATA:  Recent trauma with headaches and neck pain, initial encounter EXAM: CT HEAD WITHOUT CONTRAST CT CERVICAL SPINE WITHOUT CONTRAST TECHNIQUE: Multidetector CT imaging of the head and cervical spine was performed following the standard protocol without intravenous contrast. Multiplanar CT image reconstructions of the cervical spine were also generated. COMPARISON:  03/06/2018 FINDINGS: CT HEAD FINDINGS Brain: Chronic atrophic changes are noted. Bilateral encephalomalacia is noted within the temporal lobes as well as within the right occipital lobe stable from the prior exam. Scattered lacunar infarcts  are noted bilaterally stable from the previous study. No new focal infarct is seen. No findings to suggest acute hemorrhage, acute  infarction or space-occupying mass lesion is noted. Vascular: No hyperdense vessel or unexpected calcification. Skull: Normal. Negative for fracture or focal lesion. Sinuses/Orbits: No acute finding. Other: None. CT CERVICAL SPINE FINDINGS Alignment: Within normal limits with the exception of mild anterolisthesis of C7 on T1 of a degenerative nature. Skull base and vertebrae: 7 cervical segments are well visualized. Vertebral body height is well maintained. Multilevel osteophytic changes as well as facet hypertrophic changes are seen. No acute fracture or acute facet abnormality is noted. Soft tissues and spinal canal: Heavy vascular calcifications are seen. No other soft tissue abnormality is noted Upper chest: Scarring is noted in the apices bilaterally. Emphysematous changes are noted. Other: None IMPRESSION: CT of the head: Chronic atrophic and ischemic changes stable from the previous exam. No acute intracranial abnormality is noted. CT of the cervical spine: Multilevel degenerative change stable from the previous study. No acute abnormality is noted. Electronically Signed   By: Inez Catalina M.D.   On: 08/01/2018 19:09   Ct Cervical Spine Wo Contrast  Result Date: 08/01/2018 CLINICAL DATA:  Recent trauma with headaches and neck pain, initial encounter EXAM: CT HEAD WITHOUT CONTRAST CT CERVICAL SPINE WITHOUT CONTRAST TECHNIQUE: Multidetector CT imaging of the head and cervical spine was performed following the standard protocol without intravenous contrast. Multiplanar CT image reconstructions of the cervical spine were also generated. COMPARISON:  03/06/2018 FINDINGS: CT HEAD FINDINGS Brain: Chronic atrophic changes are noted. Bilateral encephalomalacia is noted within the temporal lobes as well as within the right occipital lobe stable from the prior exam. Scattered lacunar infarcts are noted bilaterally stable from the previous study. No new focal infarct is seen. No findings to suggest acute hemorrhage,  acute infarction or space-occupying mass lesion is noted. Vascular: No hyperdense vessel or unexpected calcification. Skull: Normal. Negative for fracture or focal lesion. Sinuses/Orbits: No acute finding. Other: None. CT CERVICAL SPINE FINDINGS Alignment: Within normal limits with the exception of mild anterolisthesis of C7 on T1 of a degenerative nature. Skull base and vertebrae: 7 cervical segments are well visualized. Vertebral body height is well maintained. Multilevel osteophytic changes as well as facet hypertrophic changes are seen. No acute fracture or acute facet abnormality is noted. Soft tissues and spinal canal: Heavy vascular calcifications are seen. No other soft tissue abnormality is noted Upper chest: Scarring is noted in the apices bilaterally. Emphysematous changes are noted. Other: None IMPRESSION: CT of the head: Chronic atrophic and ischemic changes stable from the previous exam. No acute intracranial abnormality is noted. CT of the cervical spine: Multilevel degenerative change stable from the previous study. No acute abnormality is noted. Electronically Signed   By: Inez Catalina M.D.   On: 08/01/2018 19:09   Dg Hip Unilat With Pelvis 2-3 Views Right  Result Date: 08/01/2018 CLINICAL DATA:  Right hip pain following a fall from bed. EXAM: DG HIP (WITH OR WITHOUT PELVIS) 2-3V RIGHT COMPARISON:  None. FINDINGS: Diffuse osteopenia. No fracture or dislocation is seen. Lower lumbar spine degenerative changes. Atheromatous arterial calcifications. Mildly prominent stool. IMPRESSION: No fracture or dislocation. Electronically Signed   By: Claudie Revering M.D.   On: 08/01/2018 19:27       Discharge Exam: Vitals:   08/08/18 0600 08/09/18 0348  BP: (!) 166/99 138/71  Pulse: (!) 108 93  Resp: 20 18  Temp: 97.9 F (36.6 C) 98.3 F (36.8  C)  SpO2: 100% 91%    General: Pt is resting comfortably    The results of significant diagnostics from this hospitalization (including imaging,  microbiology, ancillary and laboratory) are listed below for reference.     Microbiology: No results found for this or any previous visit (from the past 240 hour(s)).   Labs: BNP (last 3 results) No results for input(s): BNP in the last 8760 hours. Basic Metabolic Panel: Recent Labs  Lab 08/03/18 0258 08/04/18 0442 08/05/18 0309  NA 143 142 144  K 3.6 3.5 3.6  CL 108 108 111  CO2 22 21* 18*  GLUCOSE 103* 81 73  BUN 31* 23 27*  CREATININE 1.33* 1.30* 1.33*  CALCIUM 11.1* 10.7* 10.5*   Liver Function Tests: No results for input(s): AST, ALT, ALKPHOS, BILITOT, PROT, ALBUMIN in the last 168 hours. No results for input(s): LIPASE, AMYLASE in the last 168 hours. No results for input(s): AMMONIA in the last 168 hours. CBC: Recent Labs  Lab 08/03/18 0258  WBC 9.9  HGB 11.4*  HCT 34.9*  MCV 90.9  PLT 326   Cardiac Enzymes: No results for input(s): CKTOTAL, CKMB, CKMBINDEX, TROPONINI in the last 168 hours. BNP: Invalid input(s): POCBNP CBG: No results for input(s): GLUCAP in the last 168 hours. D-Dimer No results for input(s): DDIMER in the last 72 hours. Hgb A1c No results for input(s): HGBA1C in the last 72 hours. Lipid Profile No results for input(s): CHOL, HDL, LDLCALC, TRIG, CHOLHDL, LDLDIRECT in the last 72 hours. Thyroid function studies No results for input(s): TSH, T4TOTAL, T3FREE, THYROIDAB in the last 72 hours.  Invalid input(s): FREET3 Anemia work up No results for input(s): VITAMINB12, FOLATE, FERRITIN, TIBC, IRON, RETICCTPCT in the last 72 hours. Urinalysis    Component Value Date/Time   COLORURINE YELLOW 08/02/2018 0434   APPEARANCEUR CLOUDY (A) 08/02/2018 0434   LABSPEC 1.015 08/02/2018 0434   PHURINE 7.0 08/02/2018 0434   GLUCOSEU NEGATIVE 08/02/2018 0434   HGBUR MODERATE (A) 08/02/2018 0434   BILIRUBINUR NEGATIVE 08/02/2018 0434   KETONESUR 20 (A) 08/02/2018 0434   PROTEINUR 30 (A) 08/02/2018 0434   UROBILINOGEN 1.0 02/28/2014 2243    NITRITE POSITIVE (A) 08/02/2018 0434   LEUKOCYTESUR LARGE (A) 08/02/2018 0434   Sepsis Labs Invalid input(s): PROCALCITONIN,  WBC,  LACTICIDVEN Microbiology No results found for this or any previous visit (from the past 240 hour(s)).   Patient was seen and examined on the day of discharge and was found to be in stable condition. Time coordinating discharge: 25 minutes including assessment and coordination of care, as well as examination of the patient.   SIGNED:  Dessa Phi, DO [facility] [email or web address] 08/09/2018, 9:51 AM

## 2018-08-09 NOTE — Social Work (Signed)
Clinical Social Worker facilitated patient discharge including contacting patient family and facility to confirm patient discharge plans.  Clinical information faxed to facility and family agreeable with plan.  CSW arranged ambulance transport via [facility] to [facility] RN to call [phone]  with report prior to discharge.  Clinical Social Worker will sign off for now as social work intervention is no longer needed. Please consult us again if new need arises.  Evelio Rueda, MSW, LCSWA Clinical Social Worker [phone]  

## 2018-08-09 NOTE — Plan of Care (Signed)
  Problem: Activity: Goal: Ability to tolerate increased activity will improve Outcome: Not Progressing   Problem: Clinical Measurements: Goal: Ability to maintain a body temperature in the normal range will improve Outcome: Not Progressing   Problem: Respiratory: Goal: Ability to maintain adequate ventilation will improve Outcome: Not Progressing Goal: Ability to maintain a clear airway will improve Outcome: Not Progressing   Problem: Education: Goal: Knowledge of General Education information will improve Description Including pain rating scale, medication(s)/side effects and non-pharmacologic comfort measures Outcome: Not Progressing

## 2018-08-09 NOTE — Progress Notes (Signed)
Report given to Arbie Cookey, RN at [facility].

## 2018-08-09 NOTE — Plan of Care (Signed)
  Problem: Clinical Measurements: Goal: Ability to maintain a body temperature in the normal range will improve Outcome: Progressing   Problem: Respiratory: Goal: Ability to maintain adequate ventilation will improve Outcome: Progressing Goal: Ability to maintain a clear airway will improve Outcome: Progressing   

## 2018-08-09 NOTE — Social Work (Signed)
Pt has a bed at [facility] today, RN aware, MD has completed d/c summary.   Await completion of paperwork to arrange [facility].  Westley Hummer, MSW, [place] Social Worker [phone]

## 2018-08-09 NOTE — Progress Notes (Signed)
[facility] - Registration Visit at 1330  Bed available today for patient to transfer to [facility]. Chart reviewed. Met with Patient Daughter and son to confirm interest and explain services. Family agreeable to transfer today. CSW aware.  Registration paper work completed.. Dr. Orpah Melter to assume care per family request.   Please fax discharge summary to [phone].   RN please call report to [phone]. Please arrange transport for patient to arrive before noon if possible.  Thank you, .  Gar Ponto RN [facility] Liaison  [place] are on AMION

## 2018-08-09 NOTE — Progress Notes (Signed)
Nutrition Brief Note  Chart reviewed. Pt now transitioning to comfort care.  No further nutrition interventions warranted at this time.  Please re-consult as needed.   Harvin Konicek A. Christopherjohn Schiele, RD, LDN, CDE Pager: [phone] After hours Pager: [phone]  

## 2018-08-09 NOTE — Progress Notes (Signed)
Patient transferred to [facility] via [facility].

## 2018-08-19 DEATH — deceased

## 2018-09-08 ENCOUNTER — Ambulatory Visit: Payer: Self-pay | Admitting: Family

## 2018-09-08 ENCOUNTER — Encounter (HOSPITAL_COMMUNITY): Payer: Self-pay

## 2019-11-22 IMAGING — CT CT HEAD CODE STROKE
4 series · 16 of 47 positions shown, 18 images · non-contrast
Comparison: 03/02/2014

CLINICAL DATA: Code stroke.  Left-sided weakness.  Headache.

EXAM:
CT HEAD WITHOUT CONTRAST
TECHNIQUE: Contiguous axial images were obtained from the base of the skull
through the vertex without intravenous contrast.

[Series 3: head wo · axial · 0.43mm/px · z∈[-146,-26]mm · 7 of 34 slices shown, 9 images]
[im 5/34  brain]
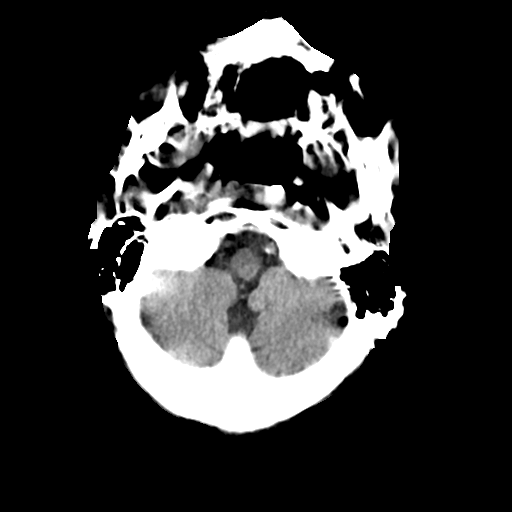
[im 5/34  bone]
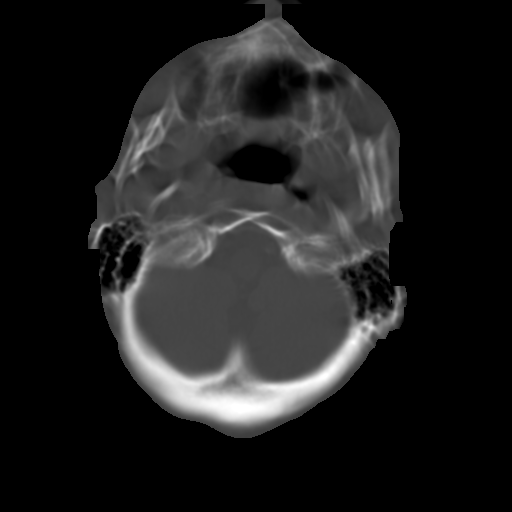
[im 9/34  brain]
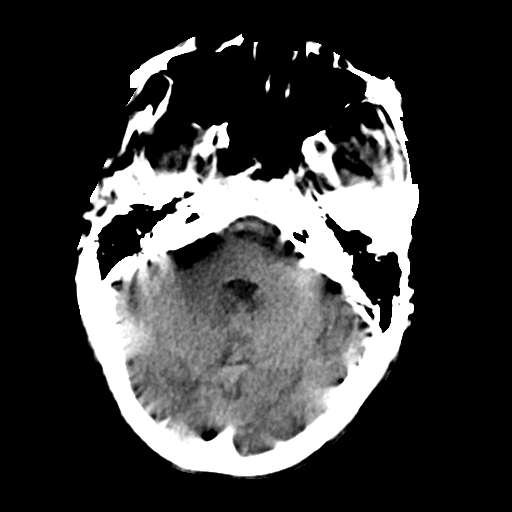
[im 13/34  brain]
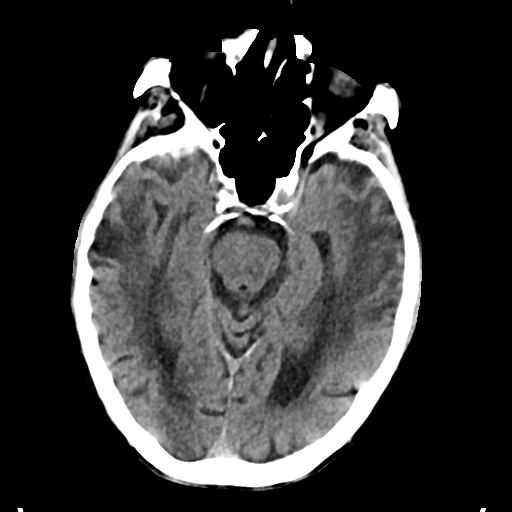
[im 17/34  brain]
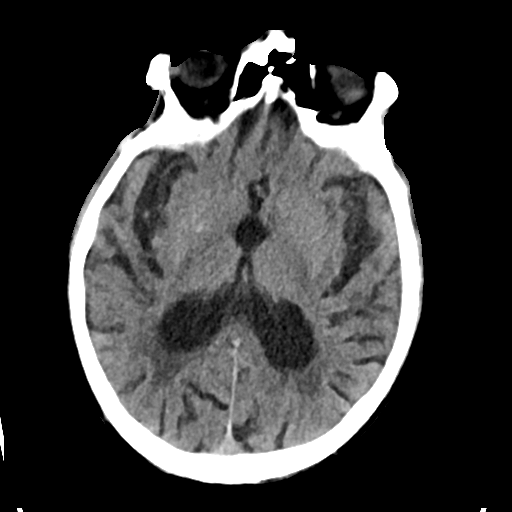
[im 21/34  brain]
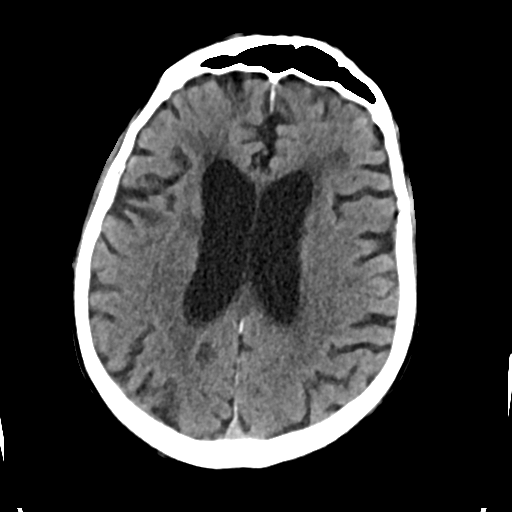
[im 21/34  bone]
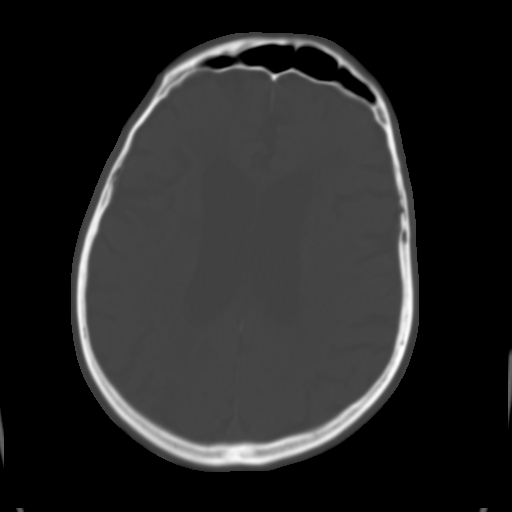
[im 25/34  brain]
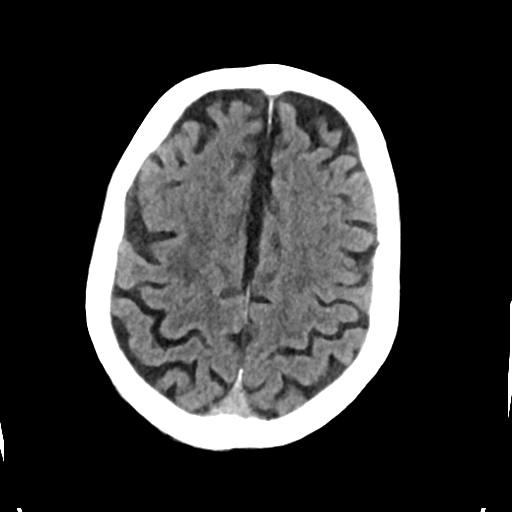
[im 29/34  brain]
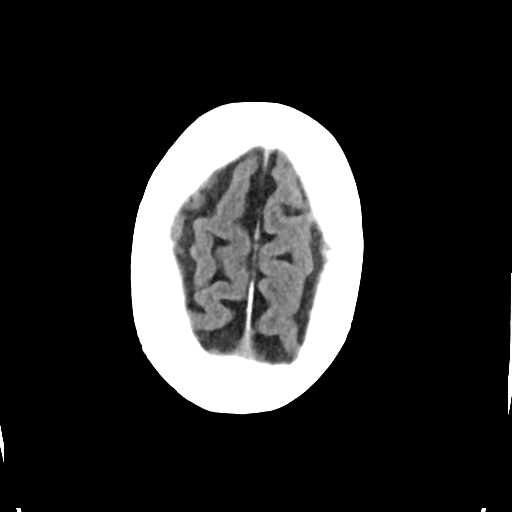

[Series 4: head bone · axial · 0.43mm/px · z∈[-150,-116]mm · 3 of 85 slices shown]
[im 9/85  bone]
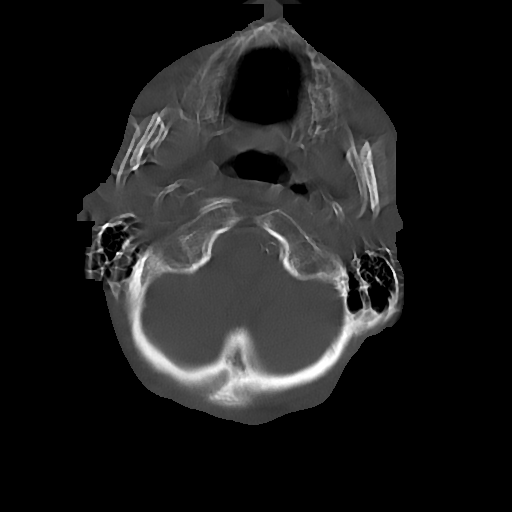
[im 17/85  bone]
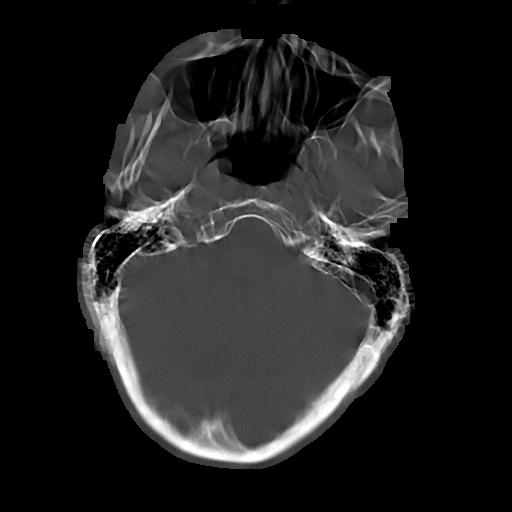
[im 26/85  bone]
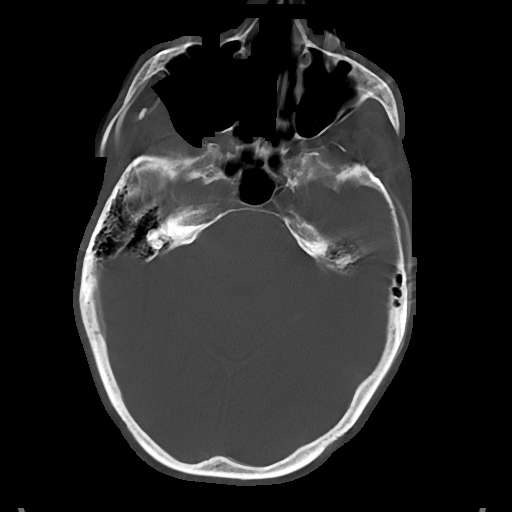

[Series 5: cor soft · coronal · 0.33mm/px · 3 of 73 slices shown]
[im 25/73  brain]
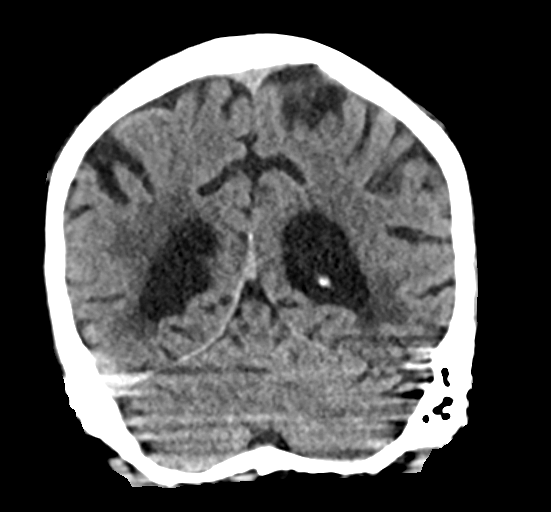
[im 33/73  brain]
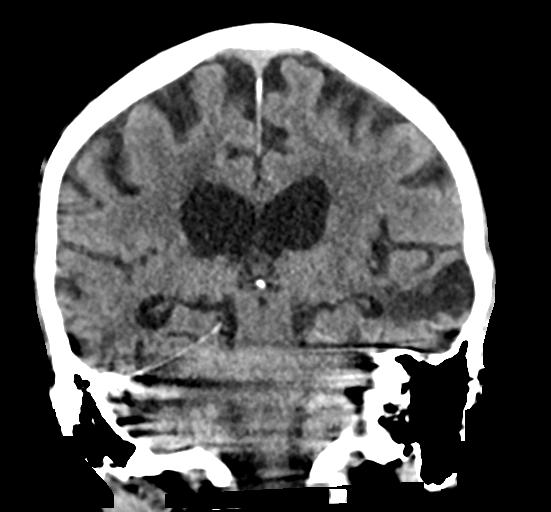
[im 41/73  brain]
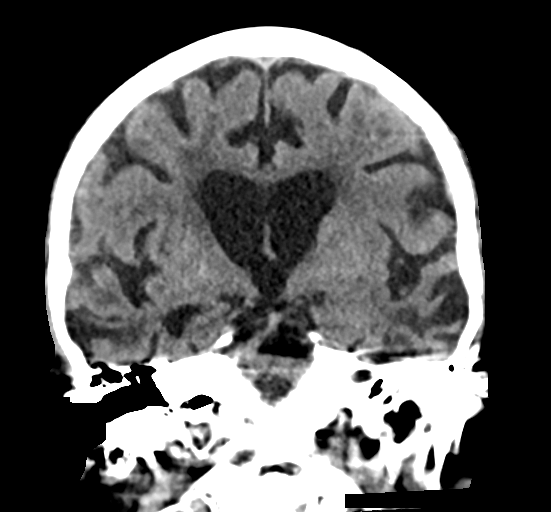

[Series 6: sag soft · sagittal · 0.33mm/px · 3 of 63 slices shown]
[im 21/63  brain]
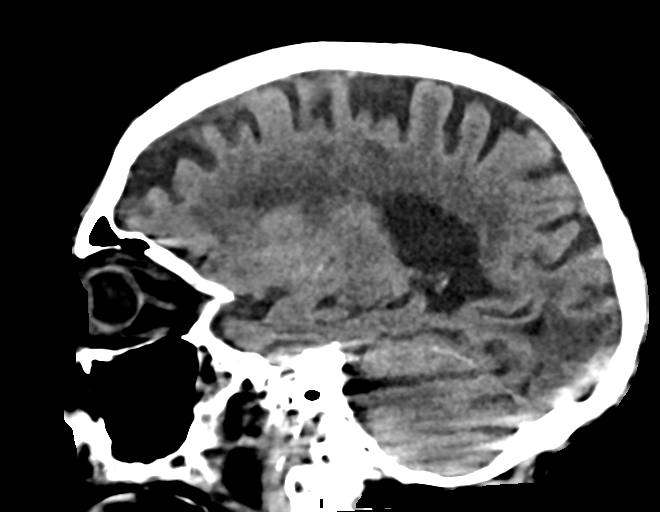
[im 32/63  brain]
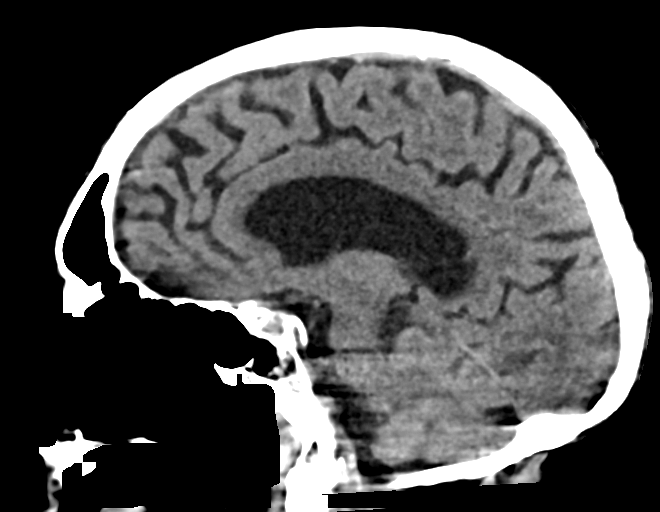
[im 42/63  brain]
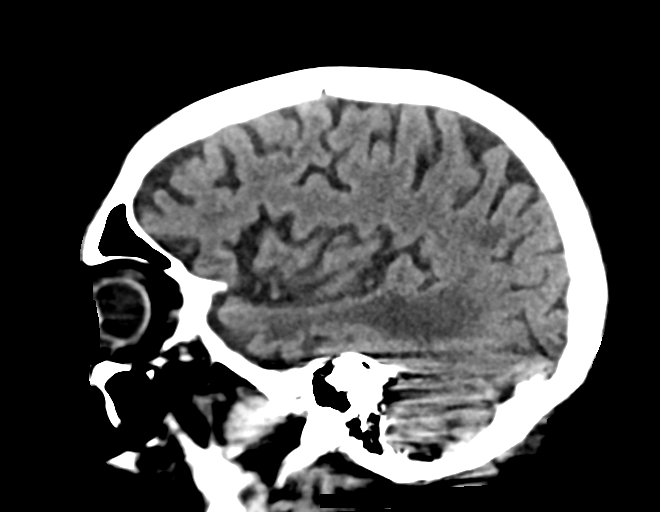

[16 of 47 positions shown; findings below may reference images not displayed]

FINDINGS: The study is motion degraded.

Brain: There are new infarcts involving the right caudate nucleus
and corona radiata of indeterminate acuity. There is also a new
small infarct in the right occipital lobe of indeterminate acuity,
with assessment limited by motion artifact. Encephalomalacia/chronic
infarcts are present in both temporal lobes and in the right frontal
operculum. No acute intracranial hemorrhage, mass, midline shift, or
extra-axial fluid collection is identified. Patchy cerebral white
matter hypodensities have progressed from 7764 and are nonspecific
but compatible with moderate chronic small vessel ischemic disease.
There is moderate cerebral atrophy.

Vascular: Calcified atherosclerosis at the skull base. No hyperdense
vessel.

Skull: No fracture or suspicious osseous lesion.

Sinuses/Orbits: Paranasal sinuses and mastoid air cells are clear.
Bilateral cataract extraction.

Other: None.

ASPECTS (Alberta Stroke Program Early CT Score)

- Ganglionic level infarction (caudate, lentiform nuclei, internal
capsule, insula, M1-M3 cortex): 6

- Supraganglionic infarction (M4-M6 cortex): 3

Total score (0-10 with 10 being normal): 9
IMPRESSION: 1. Motion degraded examination.
2. New small infarcts in the right caudate nucleus/right corona
radiata and right occipital lobe of indeterminate acuity.
3. ASPECTS is 9.
4. No acute intracranial hemorrhage.
5. Bilateral temporal lobe encephalomalacia.
6. Moderate chronic small vessel ischemic disease and cerebral
atrophy.

These results were communicated to Dr. Mihaadh at [DATE] on
03/06/2018 by text page via the AMION messaging system.
